# Patient Record
Sex: Female | Born: 1970 | Race: White | Hispanic: No | Marital: Married | Smoking: Never smoker
Health system: Southern US, Community
[De-identification: ages and names within clinical notes are randomized; demographics above are authoritative.]

## PROBLEM LIST (undated history)

## (undated) DIAGNOSIS — O24419 Gestational diabetes mellitus in pregnancy, unspecified control: Secondary | ICD-10-CM

## (undated) DIAGNOSIS — I509 Heart failure, unspecified: Secondary | ICD-10-CM

## (undated) DIAGNOSIS — K219 Gastro-esophageal reflux disease without esophagitis: Secondary | ICD-10-CM

## (undated) DIAGNOSIS — C801 Malignant (primary) neoplasm, unspecified: Secondary | ICD-10-CM

## (undated) DIAGNOSIS — J45909 Unspecified asthma, uncomplicated: Secondary | ICD-10-CM

## (undated) DIAGNOSIS — E7211 Homocystinuria: Secondary | ICD-10-CM

## (undated) DIAGNOSIS — E7212 Methylenetetrahydrofolate reductase deficiency: Secondary | ICD-10-CM

## (undated) DIAGNOSIS — D759 Disease of blood and blood-forming organs, unspecified: Secondary | ICD-10-CM

## (undated) DIAGNOSIS — Z87448 Personal history of other diseases of urinary system: Secondary | ICD-10-CM

## (undated) DIAGNOSIS — I10 Essential (primary) hypertension: Secondary | ICD-10-CM

## (undated) DIAGNOSIS — D473 Essential (hemorrhagic) thrombocythemia: Secondary | ICD-10-CM

## (undated) DIAGNOSIS — Z9221 Personal history of antineoplastic chemotherapy: Secondary | ICD-10-CM

## (undated) DIAGNOSIS — D45 Polycythemia vera: Secondary | ICD-10-CM

## (undated) DIAGNOSIS — O0990 Supervision of high risk pregnancy, unspecified, unspecified trimester: Secondary | ICD-10-CM

## (undated) HISTORY — PX: WISDOM TOOTH EXTRACTION: SHX21

## (undated) HISTORY — DX: Homocystinuria: E72.11

## (undated) HISTORY — DX: Personal history of other diseases of urinary system: Z87.448

## (undated) HISTORY — DX: Gestational diabetes mellitus in pregnancy, unspecified control: O24.419

## (undated) HISTORY — DX: Methylenetetrahydrofolate reductase deficiency: E72.12

## (undated) HISTORY — PX: TONSILLECTOMY: SUR1361

## (undated) HISTORY — PX: MYOMECTOMY: SHX85

## (undated) HISTORY — PX: COLONOSCOPY: SHX174

## (undated) HISTORY — DX: Essential (hemorrhagic) thrombocythemia: D47.3

## (undated) HISTORY — DX: Supervision of high risk pregnancy, unspecified, unspecified trimester: O09.90

---

## 1998-03-21 ENCOUNTER — Ambulatory Visit (HOSPITAL_COMMUNITY): Admission: RE | Admit: 1998-03-21 | Discharge: 1998-03-21 | Payer: Self-pay | Admitting: Internal Medicine

## 1999-04-29 ENCOUNTER — Ambulatory Visit (HOSPITAL_COMMUNITY): Admission: RE | Admit: 1999-04-29 | Discharge: 1999-04-29 | Payer: Self-pay | Admitting: Otolaryngology

## 1999-04-29 ENCOUNTER — Encounter: Payer: Self-pay | Admitting: Otolaryngology

## 2001-06-20 ENCOUNTER — Encounter: Payer: Self-pay | Admitting: Emergency Medicine

## 2001-06-20 ENCOUNTER — Emergency Department (HOSPITAL_COMMUNITY): Admission: EM | Admit: 2001-06-20 | Discharge: 2001-06-20 | Payer: Self-pay | Admitting: Emergency Medicine

## 2004-09-21 ENCOUNTER — Encounter: Admission: RE | Admit: 2004-09-21 | Discharge: 2004-09-21 | Payer: Self-pay | Admitting: Specialist

## 2004-10-30 ENCOUNTER — Encounter: Admission: RE | Admit: 2004-10-30 | Discharge: 2004-10-30 | Payer: Self-pay | Admitting: Specialist

## 2004-12-10 ENCOUNTER — Encounter: Admission: RE | Admit: 2004-12-10 | Discharge: 2004-12-10 | Payer: Self-pay | Admitting: Specialist

## 2006-12-30 ENCOUNTER — Encounter: Admission: RE | Admit: 2006-12-30 | Discharge: 2006-12-30 | Payer: Self-pay | Admitting: Specialist

## 2014-01-15 ENCOUNTER — Other Ambulatory Visit (HOSPITAL_COMMUNITY): Payer: Self-pay | Admitting: Obstetrics and Gynecology

## 2014-01-15 DIAGNOSIS — IMO0002 Reserved for concepts with insufficient information to code with codable children: Secondary | ICD-10-CM

## 2014-01-22 ENCOUNTER — Ambulatory Visit (HOSPITAL_COMMUNITY): Payer: Self-pay

## 2014-01-24 ENCOUNTER — Ambulatory Visit (HOSPITAL_COMMUNITY)
Admission: RE | Admit: 2014-01-24 | Discharge: 2014-01-24 | Disposition: A | Discharge status: Home / Self Care | Payer: BC Managed Care – PPO | Source: Ambulatory Visit | Attending: Obstetrics and Gynecology | Admitting: Obstetrics and Gynecology

## 2014-01-24 DIAGNOSIS — IMO0002 Reserved for concepts with insufficient information to code with codable children: Secondary | ICD-10-CM

## 2014-01-24 DIAGNOSIS — N979 Female infertility, unspecified: Secondary | ICD-10-CM | POA: Insufficient documentation

## 2014-01-24 MED ORDER — IOHEXOL 300 MG/ML  SOLN
20.0000 mL | Freq: Once | INTRAMUSCULAR | Status: AC | PRN
  Administered 2014-01-24: 20 mL

## 2014-06-28 ENCOUNTER — Other Ambulatory Visit (HOSPITAL_COMMUNITY): Payer: Self-pay | Admitting: Obstetrics and Gynecology

## 2014-06-28 DIAGNOSIS — N979 Female infertility, unspecified: Secondary | ICD-10-CM

## 2014-07-01 ENCOUNTER — Other Ambulatory Visit (HOSPITAL_COMMUNITY): Payer: Self-pay | Admitting: Obstetrics and Gynecology

## 2014-07-01 DIAGNOSIS — O039 Complete or unspecified spontaneous abortion without complication: Secondary | ICD-10-CM

## 2014-07-01 DIAGNOSIS — O2621 Pregnancy care for patient with recurrent pregnancy loss, first trimester: Secondary | ICD-10-CM

## 2014-07-08 ENCOUNTER — Ambulatory Visit (HOSPITAL_COMMUNITY)
Admission: RE | Admit: 2014-07-08 | Discharge: 2014-07-08 | Disposition: A | Discharge status: Home / Self Care | Payer: BC Managed Care – PPO | Source: Ambulatory Visit | Attending: Obstetrics and Gynecology | Admitting: Obstetrics and Gynecology

## 2014-07-08 DIAGNOSIS — N979 Female infertility, unspecified: Secondary | ICD-10-CM | POA: Diagnosis not present

## 2014-07-08 DIAGNOSIS — O2621 Pregnancy care for patient with recurrent pregnancy loss, first trimester: Secondary | ICD-10-CM

## 2014-07-08 DIAGNOSIS — O039 Complete or unspecified spontaneous abortion without complication: Secondary | ICD-10-CM

## 2014-07-08 MED ORDER — IOHEXOL 300 MG/ML  SOLN
20.0000 mL | Freq: Once | INTRAMUSCULAR | Status: AC | PRN
  Administered 2014-07-08: 20 mL

## 2015-01-02 ENCOUNTER — Other Ambulatory Visit: Payer: Self-pay | Admitting: Obstetrics and Gynecology

## 2015-01-03 ENCOUNTER — Encounter (HOSPITAL_COMMUNITY): Payer: Self-pay

## 2015-01-03 NOTE — Progress Notes (Signed)
Dr. Jillyn Hidden made aware of patients Polycythemia Vera Cancer no new orders received at this time.

## 2015-01-06 MED ORDER — CEFAZOLIN SODIUM-DEXTROSE 2-3 GM-% IV SOLR
2.0000 g | INTRAVENOUS | Status: AC
  Administered 2015-01-07: 2 g via INTRAVENOUS

## 2015-01-06 NOTE — Anesthesia Preprocedure Evaluation (Addendum)
Anesthesia Evaluation  Patient identified by MRN, date of birth, ID band Patient awake    Reviewed: Allergy & Precautions, NPO status , Patient's Chart, lab work & pertinent test results  History of Anesthesia Complications Negative for: history of anesthetic complications  Airway Mallampati: I  TM Distance: >3 FB Neck ROM: Full    Dental no notable dental hx. (+) Dental Advisory Given   Pulmonary asthma ,    Pulmonary exam normal       Cardiovascular negative cardio ROS      Neuro/Psych negative neurological ROS  negative psych ROS   GI/Hepatic Neg liver ROS, GERD-  Medicated and Controlled,  Endo/Other  obesity  Renal/GU negative Renal ROS  negative genitourinary   Musculoskeletal negative musculoskeletal ROS (+)   Abdominal Normal abdominal exam  (+)   Peds negative pediatric ROS (+)  Hematology  (+) Blood dyscrasia (polycythemia vera), ,   Anesthesia Other Findings   Reproductive/Obstetrics (+) Pregnancy                           Anesthesia Physical Anesthesia Plan  ASA: II  Anesthesia Plan: MAC and General   Post-op Pain Management:    Induction: Intravenous  Airway Management Planned: LMA  Additional Equipment:   Intra-op Plan:   Post-operative Plan:   Informed Consent: I have reviewed the patients History and Physical, chart, labs and discussed the procedure including the risks, benefits and alternatives for the proposed anesthesia with the patient or authorized representative who has indicated his/her understanding and acceptance.   Dental advisory given  Plan Discussed with: CRNA and Surgeon  Anesthesia Plan Comments:        Anesthesia Quick Evaluation

## 2015-01-06 NOTE — H&P (Signed)
[name], Leslie Velasquez NO.:  [id number]  MEDICAL RECORD NO.:  [id number]  LOCATION:  [facility]                         FACILITY:  [facility]  PHYSICIAN:  Lovenia Kim, M.D.DATE OF BIRTH:  [birth year]  DATE OF ADMISSION:  01/02/2015 DATE OF DISCHARGE:                             HISTORY & PHYSICAL   CHIEF COMPLAINT:  Missed abortion.  HISTORY OF PRESENT ILLNESS:  A 44 year old white female, G3, P0 with a history of recurrent pregnancy loss for D and E for missed AB and tissue for chromosomes.  ALLERGIES:  She has no known drug allergies.  MEDICATIONS:  Folic acid, interferon, reflux medication, baby aspirin, multivitamins.  PAST MEDICAL HISTORY:  She has a history of miscarriage x2.  SURGICAL HISTORY:  For submucous fibroid removal, tonsillectomy, wisdom tooth extraction, breast augmentation.  FAMILY HISTORY:  Diabetes, breast cancer, hypertension, esophageal cancer.  Personal history of essential thrombocythemia.  PHYSICAL EXAMINATION:  GENERAL:  Well-developed well-nourished white female, in no acute distress. HEENT:  Normal. NECK:  Supple.  Full range of motion. LUNGS:  Clear. HEART:  Regular rate and rhythm. ABDOMEN:  Soft, nontender.  Pelvic exam reveals a big-sized uterus and no adnexal masses. EXTREMITIES:  There are no cords. NEUROLOGIC:  Nonfocal. SKIN:  Intact.  IMPRESSION:  Missed abortion at [redacted] weeks gestation with 8-week fetal demise noted.  PLAN:  Suction D and E with tissue for chromosomes.  Risks of anesthesia, infection, bleeding, injury to surrounding organs, possible need for repair was discussed.  Delayed versus immediate complications to include bowel and bladder injury noted.  The patient acknowledges and wishes to proceed.     Lovenia Kim, M.D.     RJT/MEDQ  D:  01/06/2015  T:  01/06/2015  Job:  [id number]  cc:   Lovenia Kim, M.D. Fax: [phone]

## 2015-01-07 ENCOUNTER — Ambulatory Visit (HOSPITAL_COMMUNITY): Payer: BLUE CROSS/BLUE SHIELD | Admitting: Anesthesiology

## 2015-01-07 ENCOUNTER — Encounter (HOSPITAL_COMMUNITY): Payer: Self-pay | Admitting: Anesthesiology

## 2015-01-07 ENCOUNTER — Ambulatory Visit (HOSPITAL_COMMUNITY)
Admission: RE | Admit: 2015-01-07 | Discharge: 2015-01-07 | Disposition: A | Discharge status: Home / Self Care | Payer: BLUE CROSS/BLUE SHIELD | Source: Ambulatory Visit | Attending: Obstetrics and Gynecology | Admitting: Obstetrics and Gynecology

## 2015-01-07 ENCOUNTER — Encounter (HOSPITAL_COMMUNITY)
Admission: RE | Disposition: A | Discharge status: Home / Self Care | Payer: Self-pay | Source: Ambulatory Visit | Attending: Obstetrics and Gynecology

## 2015-01-07 DIAGNOSIS — O021 Missed abortion: Secondary | ICD-10-CM | POA: Diagnosis present

## 2015-01-07 DIAGNOSIS — N96 Recurrent pregnancy loss: Secondary | ICD-10-CM | POA: Insufficient documentation

## 2015-01-07 HISTORY — PX: DILATION AND EVACUATION: SHX1459

## 2015-01-07 HISTORY — DX: Unspecified asthma, uncomplicated: J45.909

## 2015-01-07 HISTORY — DX: Personal history of antineoplastic chemotherapy: Z92.21

## 2015-01-07 HISTORY — DX: Malignant (primary) neoplasm, unspecified: C80.1

## 2015-01-07 HISTORY — DX: Disease of blood and blood-forming organs, unspecified: D75.9

## 2015-01-07 HISTORY — DX: Gastro-esophageal reflux disease without esophagitis: K21.9

## 2015-01-07 HISTORY — DX: Polycythemia vera: D45

## 2015-01-07 LAB — CBC
HCT: 40.5 % (ref 36.0–46.0)
HEMOGLOBIN: 13.4 g/dL (ref 12.0–15.0)
MCH: 27.6 pg (ref 26.0–34.0)
MCHC: 33.1 g/dL (ref 30.0–36.0)
MCV: 83.3 fL (ref 78.0–100.0)
Platelets: 802 10*3/uL — ABNORMAL HIGH (ref 150–400)
RBC: 4.86 MIL/uL (ref 3.87–5.11)
RDW: 21.4 % — ABNORMAL HIGH (ref 11.5–15.5)
WBC: 9 10*3/uL (ref 4.0–10.5)

## 2015-01-07 SURGERY — DILATION AND EVACUATION, UTERUS
Anesthesia: Monitor Anesthesia Care

## 2015-01-07 MED ORDER — FENTANYL CITRATE 0.05 MG/ML IJ SOLN
25.0000 ug | INTRAMUSCULAR | Status: DC | PRN
  Administered 2015-01-07 (×3): 50 ug via INTRAVENOUS

## 2015-01-07 MED ORDER — SCOPOLAMINE 1 MG/3DAYS TD PT72: 1.0000 | MEDICATED_PATCH | Freq: Once | TRANSDERMAL | Status: DC

## 2015-01-07 MED ORDER — LIDOCAINE HCL (CARDIAC) 20 MG/ML IV SOLN
INTRAVENOUS | Status: DC | PRN
  Administered 2015-01-07: 30 mg via INTRAVENOUS

## 2015-01-07 MED ORDER — ONDANSETRON HCL 4 MG/2ML IJ SOLN: 4.0000 mg | Freq: Once | INTRAMUSCULAR | Status: DC | PRN

## 2015-01-07 MED ORDER — FENTANYL CITRATE 0.05 MG/ML IJ SOLN
INTRAMUSCULAR | Status: AC
  Administered 2015-01-07: 50 ug via INTRAVENOUS
  Filled 2015-01-07: qty 2

## 2015-01-07 MED ORDER — FENTANYL CITRATE 0.05 MG/ML IJ SOLN
INTRAMUSCULAR | Status: AC
  Filled 2015-01-07: qty 2

## 2015-01-07 MED ORDER — BUPIVACAINE HCL (PF) 0.25 % IJ SOLN
INTRAMUSCULAR | Status: DC | PRN
  Administered 2015-01-07: 20 mL

## 2015-01-07 MED ORDER — FENTANYL CITRATE 0.05 MG/ML IJ SOLN
INTRAMUSCULAR | Status: DC | PRN
  Administered 2015-01-07: 100 ug via INTRAVENOUS

## 2015-01-07 MED ORDER — SCOPOLAMINE 1 MG/3DAYS TD PT72
MEDICATED_PATCH | TRANSDERMAL | Status: AC
  Filled 2015-01-07: qty 1

## 2015-01-07 MED ORDER — CEFAZOLIN SODIUM-DEXTROSE 2-3 GM-% IV SOLR
INTRAVENOUS | Status: AC
  Filled 2015-01-07: qty 50

## 2015-01-07 MED ORDER — LACTATED RINGERS IV SOLN
INTRAVENOUS | Status: DC
  Administered 2015-01-07: 12:00:00 via INTRAVENOUS

## 2015-01-07 MED ORDER — 0.9 % SODIUM CHLORIDE (POUR BTL) OPTIME
TOPICAL | Status: DC | PRN
  Administered 2015-01-07: 1000 mL

## 2015-01-07 MED ORDER — ONDANSETRON HCL 4 MG/2ML IJ SOLN
INTRAMUSCULAR | Status: DC | PRN
  Administered 2015-01-07: 4 mg via INTRAVENOUS

## 2015-01-07 MED ORDER — HYDROCODONE-ACETAMINOPHEN 5-325 MG PO TABS
1.0000 | ORAL_TABLET | Freq: Once | ORAL | Status: AC
  Administered 2015-01-07: 1 via ORAL

## 2015-01-07 MED ORDER — MIDAZOLAM HCL 2 MG/2ML IJ SOLN
INTRAMUSCULAR | Status: DC | PRN
  Administered 2015-01-07: 2 mg via INTRAVENOUS

## 2015-01-07 MED ORDER — DEXAMETHASONE SODIUM PHOSPHATE 10 MG/ML IJ SOLN
INTRAMUSCULAR | Status: DC | PRN
  Administered 2015-01-07: 4 mg via INTRAVENOUS

## 2015-01-07 MED ORDER — HYDROCODONE-ACETAMINOPHEN 5-325 MG PO TABS
ORAL_TABLET | ORAL | Status: AC
  Filled 2015-01-07: qty 1

## 2015-01-07 MED ORDER — HYDROCODONE-IBUPROFEN 7.5-200 MG PO TABS: 1.0000 | ORAL_TABLET | Freq: Three times a day (TID) | ORAL | Status: DC | PRN

## 2015-01-07 MED ORDER — BUPIVACAINE HCL (PF) 0.25 % IJ SOLN
INTRAMUSCULAR | Status: AC
  Filled 2015-01-07: qty 30

## 2015-01-07 MED ORDER — PROPOFOL 10 MG/ML IV BOLUS
INTRAVENOUS | Status: DC | PRN
  Administered 2015-01-07 (×3): 20 mg via INTRAVENOUS

## 2015-01-07 MED ORDER — KETOROLAC TROMETHAMINE 30 MG/ML IJ SOLN
INTRAMUSCULAR | Status: DC | PRN
  Administered 2015-01-07: 30 mg via INTRAVENOUS

## 2015-01-07 SURGICAL SUPPLY — 16 items
CATH ROBINSON RED A/P 16FR (CATHETERS) ×2 IMPLANT
CLOTH BEACON ORANGE TIMEOUT ST (SAFETY) ×2 IMPLANT
DECANTER SPIKE VIAL GLASS SM (MISCELLANEOUS) ×2 IMPLANT
GLOVE BIO SURGEON STRL SZ7.5 (GLOVE) ×2 IMPLANT
GOWN STRL REUS W/TWL LRG LVL3 (GOWN DISPOSABLE) ×4 IMPLANT
KIT BERKELEY 1ST TRIMESTER 3/8 (MISCELLANEOUS) ×2 IMPLANT
NS IRRIG 1000ML POUR BTL (IV SOLUTION) ×2 IMPLANT
PACK VAGINAL MINOR WOMEN LF (CUSTOM PROCEDURE TRAY) ×2 IMPLANT
PAD OB MATERNITY 4.3X12.25 (PERSONAL CARE ITEMS) ×2 IMPLANT
PAD PREP 24X48 CUFFED NSTRL (MISCELLANEOUS) ×2 IMPLANT
SET BERKELEY SUCTION TUBING (SUCTIONS) ×2 IMPLANT
TOWEL OR 17X24 6PK STRL BLUE (TOWEL DISPOSABLE) ×4 IMPLANT
VACURETTE 10 RIGID CVD (CANNULA) IMPLANT
VACURETTE 7MM CVD STRL WRAP (CANNULA) IMPLANT
VACURETTE 8 RIGID CVD (CANNULA) IMPLANT
VACURETTE 9 RIGID CVD (CANNULA) ×1 IMPLANT

## 2015-01-07 NOTE — Anesthesia Postprocedure Evaluation (Signed)
Anesthesia Post Note  Patient: Leslie Velasquez  Procedure(s) Performed: Procedure(s) (LRB): DILATATION AND EVACUATION with Tissue Sent For Chromosome Analysis (N/A)  Anesthesia type: MAC  Patient location: PACU  Post pain: Pain level controlled  Post assessment: Post-op Vital signs reviewed  Last Vitals:  Filed Vitals:   01/07/15 1400  BP: 120/52  Pulse: 83  Temp:   Resp: 21    Post vital signs: Reviewed  Level of consciousness: sedated  Complications: No apparent anesthesia complications

## 2015-01-07 NOTE — Transfer of Care (Signed)
Immediate Anesthesia Transfer of Care Note  Patient: Leslie Velasquez  Procedure(s) Performed: Procedure(s): DILATATION AND EVACUATION with Tissue Sent For Chromosome Analysis (N/A)  Patient Location: PACU  Anesthesia Type:MAC  Level of Consciousness: sedated  Airway & Oxygen Therapy: Patient Spontanous Breathing  Post-op Assessment: Report given to RN  Post vital signs: Reviewed and stable  Last Vitals:  Filed Vitals:   01/07/15 1154  BP: 126/75  Pulse:   Temp:   Resp:     Complications: No apparent anesthesia complications

## 2015-01-07 NOTE — Discharge Instructions (Signed)
DISCHARGE INSTRUCTIONS: D&E The following instructions have been prepared to help you care for yourself upon your return home.  MAY TAKE IBUPROFEN (MOTRIN, ADVIL) OR ALEVE AFTER 7:25 PM FOR PAIN!!    Personal hygiene:  Use sanitary pads for vaginal drainage, not tampons.  Shower the day after your procedure.  NO tub baths, pools or Jacuzzis for 2-3 weeks.  Wipe front to back after using the bathroom.  Activity and limitations:  Do NOT drive or operate any equipment for 24 hours. The effects of anesthesia are still present and drowsiness may result.  Do NOT rest in bed all day.  Walking is encouraged.  Walk up and down stairs slowly.  You may resume your normal activity in one to two days or as indicated by your physician.  Sexual activity: NO intercourse for at least 2 weeks after the procedure, or as indicated by your physician.  Diet: Eat a light meal as desired this evening. You may resume your usual diet tomorrow.  Return to work: You may resume your work activities in one to two days or as indicated by your doctor.  What to expect after your surgery: Expect to have vaginal bleeding/discharge for 2-3 days and spotting for up to 10 days. It is not unusual to have soreness for up to 1-2 weeks. You may have a slight burning sensation when you urinate for the first day. Mild cramps may continue for a couple of days. You may have a regular period in 2-6 weeks.  Call your doctor for any of the following:  Excessive vaginal bleeding, saturating and changing one pad every hour.  Inability to urinate 6 hours after discharge from hospital.  Pain not relieved by pain medication.  Fever of 100.4 F or greater.  Unusual vaginal discharge or odor.   Call for an appointment:    Patients signature: ______________________  Nurses signature ________________________  Support person's signature_______________________

## 2015-01-07 NOTE — Op Note (Signed)
01/07/2015  1:27 PM  PATIENT:  Leslie Velasquez  44 y.o. female  PRE-OPERATIVE DIAGNOSIS:  Missed Abortion, Recurrent Pregnancy Loss  POST-OPERATIVE DIAGNOSIS:  Missed Abortion, Recurrent Pregnancy Loss  PROCEDURE:  Procedure(s): DILATATION AND EVACUATION with Tissue Sent For Chromosome Analysis  SURGEON:  Surgeon(s): Brien Few, MD  ASSISTANTS: none   ANESTHESIA:   local and IV sedation  ESTIMATED BLOOD LOSS: * No blood loss amount entered *   DRAINS: none   LOCAL MEDICATIONS USED:  MARCAINE    and Amount: 20 ml  SPECIMEN:  Source of Specimen:  poc  DISPOSITION OF SPECIMEN:  PATHOLOGY  COUNTS:  YES  DICTATION #: [id number]  PLAN OF CARE: dc home  PATIENT DISPOSITION:  PACU - hemodynamically stable.

## 2015-01-07 NOTE — Progress Notes (Signed)
Patient ID: Leslie Velasquez, female   DOB: 09-30-1971, 45 y.o.   MRN: [id number] Patient seen and examined. Consent witnessed and signed. No changes noted. Update completed.

## 2015-01-08 ENCOUNTER — Encounter (HOSPITAL_COMMUNITY): Payer: Self-pay | Admitting: Obstetrics and Gynecology

## 2015-01-08 NOTE — Op Note (Signed)
Leslie Velasquez, Leslie Velasquez NO.:  [id number]  MEDICAL RECORD NO.:  [id number]  LOCATION:  [facility]                          FACILITY:  [facility]  PHYSICIAN:  Lovenia Kim, M.D.DATE OF BIRTH:  June 02, 1971  DATE OF PROCEDURE: DATE OF DISCHARGE:  01/07/2015                              OPERATIVE REPORT   PREOPERATIVE DIAGNOSES:  Missed abortion at 11 weeks, recurrent pregnancy loss x3.  POSTOPERATIVE DIAGNOSES:  Missed abortion at 11 weeks, recurrent pregnancy loss x3.  PROCEDURE:  Suction, dilatation and evacuation.  SURGEON:  Lovenia Kim, M.D.  ASSISTANT:  None.  ANESTHESIA:  IV sedation and local.  ESTIMATED BLOOD LOSS:  Less than 50 mL.  COMPLICATIONS:  None.  DRAINS:  None.  COUNTS:  Correct.  TISSUE:  Products of conception to Pathology for permanent confirmation and tissue sent for chromosomal analysis and aneuploidy FISH screen.  DESCRIPTION OF PROCEDURE:  After being apprised of the risks of anesthesia, infection, bleeding, injury to surrounding organs with possible need for repair, the patient was brought to the operating room. Time-out was performed.  The patient was then placed in dorsal lithotomy position, prepped and draped in usual sterile fashion.  Catheterized until the bladder was empty.  Feet were placed in the Yellofin stirrups. Exam under anesthesia revealed 8-10-week size anteflexed uterus and no adnexal masses.  Dilute Marcaine solution was placed using 20 mL total of a dilute 0.25% Marcaine solution.  Cervix was easily dilated up to a #27 Pratt dilator.  A 9-mm suction curette placed.  Aspiration of products of conception was done without difficulty.  Repeat suction and curettage bluntly in a 4-quadrant method revealed the cavity to be empty.  Tissue was collected and inspected, sent for analysis as noted.  The patient tolerated the procedure well.  All instruments were removed.  She was awakened and transferred to recovery in  good condition.     Lovenia Kim, M.D.     RJT/MEDQ  D:  01/07/2015  T:  01/08/2015  Job:  [id number]

## 2015-01-28 NOTE — Addendum Note (Signed)
Addendum  created 01/28/15 0710 by Lyn Hollingshead, MD   Modules edited: Clinical Notes   Clinical Notes:  File: [id number]

## 2015-01-28 NOTE — Addendum Note (Signed)
Addendum  created 01/28/15 0211 by Lyn Hollingshead, MD   Modules edited: Anesthesia Attestations

## 2015-01-31 LAB — CHROMOSOME STD, POC(TISSUE)-NCBH

## 2015-02-10 LAB — TISSUE HYBRIDIZATION TO NCBH

## 2015-08-07 LAB — OB RESULTS CONSOLE GC/CHLAMYDIA
Chlamydia: NEGATIVE
Gonorrhea: NEGATIVE

## 2015-08-07 LAB — OB RESULTS CONSOLE RPR: RPR: NONREACTIVE

## 2015-08-07 LAB — OB RESULTS CONSOLE HEPATITIS B SURFACE ANTIGEN: Hepatitis B Surface Ag: NEGATIVE

## 2015-08-07 LAB — OB RESULTS CONSOLE HIV ANTIBODY (ROUTINE TESTING): HIV: NONREACTIVE

## 2015-08-07 LAB — OB RESULTS CONSOLE RUBELLA ANTIBODY, IGM: Rubella: IMMUNE

## 2015-09-17 LAB — OB RESULTS CONSOLE ANTIBODY SCREEN: Antibody Screen: NEGATIVE

## 2015-09-17 LAB — OB RESULTS CONSOLE ABO/RH: RH Type: POSITIVE

## 2015-12-03 ENCOUNTER — Encounter: Payer: BLUE CROSS/BLUE SHIELD | Attending: Obstetrics and Gynecology

## 2015-12-03 VITALS — Ht 64.0 in | Wt 224.0 lb

## 2015-12-03 DIAGNOSIS — O99810 Abnormal glucose complicating pregnancy: Secondary | ICD-10-CM | POA: Insufficient documentation

## 2015-12-03 DIAGNOSIS — O24419 Gestational diabetes mellitus in pregnancy, unspecified control: Secondary | ICD-10-CM

## 2015-12-06 NOTE — Progress Notes (Signed)
  Patient was seen on 12/03/15 for Gestational Diabetes self-management . The following learning objectives were met by the patient :   States the definition of Gestational Diabetes  States why dietary management is important in controlling blood glucose  Describes the effects of carbohydrates on blood glucose levels  Demonstrates ability to create a balanced meal plan  Demonstrates carbohydrate counting   States when to check blood glucose levels  Demonstrates proper blood glucose monitoring techniques  States the effect of stress and exercise on blood glucose levels  States the importance of limiting caffeine and abstaining from alcohol and smoking  Plan:  Aim for 2 Carb Choices per meal (30 grams) +/- 1 either way for breakfast Aim for 3 Carb Choices per meal (45 grams) +/- 1 either way from lunch and dinner Aim for 1-2 Carbs per snack Begin reading food labels for Total Carbohydrate and sugar grams of foods Consider  increasing your activity level by walking daily as tolerated Begin checking BG before breakfast and 1-2 hours after first bit of breakfast, lunch and dinner after  as directed by MD  Take medication  as directed by MD  Blood glucose monitor given: One Touch Verio Flex Lot # [id number] X Exp: 12/2016 Blood glucose reading: '81mg'$ /dl  Patient instructed to monitor glucose levels: FBS: 60 - <90 1 hour: <140 2 hour: <120  Patient received the following handouts:  Nutrition Diabetes and Pregnancy  Carbohydrate Counting List  Meal Planning worksheet  Patient will be seen for follow-up as needed.

## 2016-01-29 ENCOUNTER — Encounter (HOSPITAL_COMMUNITY): Payer: Self-pay | Admitting: *Deleted

## 2016-01-29 ENCOUNTER — Telehealth (HOSPITAL_COMMUNITY): Payer: Self-pay | Admitting: *Deleted

## 2016-01-29 NOTE — Telephone Encounter (Signed)
Preadmission screen  

## 2016-01-30 ENCOUNTER — Other Ambulatory Visit: Payer: Self-pay | Admitting: Obstetrics and Gynecology

## 2016-02-02 ENCOUNTER — Encounter: Payer: Self-pay | Admitting: Oncology

## 2016-02-02 ENCOUNTER — Ambulatory Visit (INDEPENDENT_AMBULATORY_CARE_PROVIDER_SITE_OTHER): Payer: BLUE CROSS/BLUE SHIELD | Admitting: Oncology

## 2016-02-02 ENCOUNTER — Telehealth: Payer: Self-pay | Admitting: *Deleted

## 2016-02-02 VITALS — BP 144/77 | HR 84 | Temp 98.4°F | Ht 66.0 in | Wt 242.7 lb

## 2016-02-02 DIAGNOSIS — D45 Polycythemia vera: Secondary | ICD-10-CM

## 2016-02-02 DIAGNOSIS — O24419 Gestational diabetes mellitus in pregnancy, unspecified control: Secondary | ICD-10-CM | POA: Diagnosis not present

## 2016-02-02 DIAGNOSIS — O26893 Other specified pregnancy related conditions, third trimester: Secondary | ICD-10-CM | POA: Diagnosis not present

## 2016-02-02 DIAGNOSIS — O0990 Supervision of high risk pregnancy, unspecified, unspecified trimester: Secondary | ICD-10-CM | POA: Insufficient documentation

## 2016-02-02 DIAGNOSIS — D473 Essential (hemorrhagic) thrombocythemia: Secondary | ICD-10-CM | POA: Insufficient documentation

## 2016-02-02 DIAGNOSIS — O0993 Supervision of high risk pregnancy, unspecified, third trimester: Secondary | ICD-10-CM

## 2016-02-02 DIAGNOSIS — Z3A36 36 weeks gestation of pregnancy: Secondary | ICD-10-CM

## 2016-02-02 HISTORY — DX: Gestational diabetes mellitus in pregnancy, unspecified control: O24.419

## 2016-02-02 HISTORY — DX: Supervision of high risk pregnancy, unspecified, unspecified trimester: O09.90

## 2016-02-02 HISTORY — DX: Essential (hemorrhagic) thrombocythemia: D47.3

## 2016-02-02 HISTORY — DX: Polycythemia vera: D45

## 2016-02-02 MED ORDER — ENOXAPARIN SODIUM 40 MG/0.4ML ~~LOC~~ SOLN: 60.0000 mg | SUBCUTANEOUS | Status: DC

## 2016-02-02 NOTE — Telephone Encounter (Signed)
Pharm calls and ask for clarification of new lovenox script, read your Pt instructions and told them that it is to be lovenox 60mg ,  inject 0.64mls/  60 mg into the skin daily. Do you agree? Please change med list to reflect, thanks

## 2016-02-02 NOTE — Telephone Encounter (Signed)
Yes! I already changed med list when I updated the order. What I didn't know is whether they had 60 mg in a pre-filled syringe

## 2016-02-02 NOTE — Progress Notes (Signed)
Patient ID: Leslie Velasquez, female   DOB: [birth year], 45 y.o.   MRN: [id number] New Patient Hematology   Leslie Velasquez [id number] Leslie Velasquez 45 y.o. 02/02/2016  CC: Dr. Brien Few;    Reason for referral:  Assist in management of high risk pregnancy in a young woman with an underlying myeloproliferative disorder   HPI:  45 year old woman who was in overall good health until age 77. She was found have a persistently elevated lately count of over 800,000. She had excessive fatigue, a sense of increased pressure in her head, night sweats, and almost daily headaches. She was referred to Dr. 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 This was initially just limited to an elevated platelet count but over time, she had progressive erythrocytosis with hematocrit up to 54% and a low erythropoietin level consistent with polycythemia vera. She was started on low-dose aspirin and a phlebotomy program. The majority of her symptoms resolved. She became pregnant about 2 years ago at age 77. She had a spontaneous miscarriage that was associated with significant bleeding. She became pregnant again about 8 months later and had another miscarriage. She underwent a D&C procedure. She does not recall having excessive bleeding following that. She had surgery to control menorrhagia related to a fibroid uterus approximately 3 years ago. Her platelet count became difficult to control. About one year ago she was started on pegylated interferon. The dose has been titrated to current dose of 180 mg subcutaneous weekly. She is now pregnant for the third time. The pregnancy is progressing well except for borderline elevation of blood pressure and gestational diabetes. She was started on glyburide. An elective cesarean section is planned for next week on May 2. Her hemoglobin has drifted down during the pregnancy and  she has not required phlebotomy. Last phlebotomy done approximately August 2016. Her platelet count has also come under better control with most recent value of 249,000 on April 21. Hemoglobin 10.6. MCV 92. White count 6500. She recently had a second opinion consultation at the [facility] in [place] where she saw a doctor [place]. I have some of the records from that visit. In view of her increased thrombotic risk,  prophylactic dose low molecular weight heparin was recommended for the 2 weeks antepartum and 4-6 weeks postpartum. This was just started last week at initial dose of 40 mg. Her prepregnant weight was 207 pounds. Current weight 243 pounds. BMI 39.  In view of the prior history of excessive bleeding following spontaneous miscarriage, and the fact that a small number of patients with essential thrombocythemia can develop an acquired von Willebrand's disorder, I had her OB/GYN check a von Willebrand panel on April 13. Results show what is expected for a normal pregnant woman with elevated levels of von Willebrand factor antigen (198% of control), and ristocetin cofactor activity, 135% of control, and factor VIII activity, 236% of control.  Von Willebrand multimers are normal.  BUN 13, creatinine 0.5, liver chemistries normal except decreased albumin 3.3 g percent and elevated alkaline phosphatase 138 normal for pregnancy. TSH 2.35. Uric acid 5.7.    PMH: Past Medical History  Diagnosis Date  . Asthma   . GERD (gastroesophageal reflux disease)   . 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 Cancer ([place])     polycythemia vera cancer  . Polycythemia vera ([place])   . Personal history of chemotherapy  currently every Monday  . Gestational diabetes mellitus (GDM), antepartum   . 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 She has asthma and seasonal allergies and uses when necessary  antihistamines. She had recurrent kidney infections as a child but not as an adult.  Past Surgical History  Procedure Laterality Date  . Myomectomy: Done for menorrhagia/fibroid uterus     . Tonsillectomy: With no excessive bleeding     . Wisdom tooth extraction: With no excessive bleeding     . Colonoscopy    . Dilation and evacuation: No excessive bleeding  N/A 01/07/2015    Procedure: DILATATION AND EVACUATION with Tissue Sent For Chromosome Analysis;  Surgeon: Brien Few, MD;  Location: [facility];  Service: Gynecology;  Laterality: N/A;    Allergies: No Known Allergies  Medications:  Current outpatient prescriptions:  .  enoxaparin (LOVENOX) 40 MG/0.4ML injection, Inject 0.6 mLs (60 mg total) into the skin daily., Disp: 30 Syringe, Rfl: 2 .  folic acid (FOLVITE) 1 MG tablet, Take 4 mg by mouth daily., Disp: , Rfl:  .  pantoprazole (PROTONIX) 40 MG tablet, Take 40 mg by mouth daily., Disp: , Rfl:  .  Peginterferon alfa-2a (PEGASYS [place]), Inject 1 each into the skin once a week. Patient receives injection every Monday at [facility] Disp: , Rfl:  .  Prenatal Vit-Fe Fumarate-FA (PRENATAL MULTIVITAMIN) TABS tablet, Take 1 tablet by mouth daily at 12 noon. , Disp: , Rfl:   Social History: She is married and her husband accompanies her today. She does not work outside the home.   she has never smoked. She has never used smokeless tobacco. she does not drink alcohol or use illicit drugs.  Family History: Her father is alive at age 49 and is being treated for advanced pancreatic cancer diagnosed in July 2016. Mother is alive with hypertension and diabetes. She has 2 brothers one younger one older both alive and healthy. No one else in the family has a primary blood disorder.  Review of Systems: See history of present illness Remaining ROS negative.  Physical Exam: Blood pressure 144/77, pulse 84, temperature 98.4 F (36.9 C), temperature source Oral, height 5\' 6"  (1.676 m), weight 242 lb  11.2 oz (110.088 kg), last menstrual period 05/26/2015, SpO2 98 %. Wt Readings from Last 3 Encounters:  02/02/16 242 lb 11.2 oz (110.088 kg)  12/06/15 224 lb (101.606 kg)  01/02/15 215 lb (97.523 kg)     General appearance: Well nourished Caucasian woman HENNT: Pharynx no erythema, exudate, mass, or ulcer. No thyromegaly or thyroid nodules Lymph nodes: No cervical, supraclavicular, or axillary lymphadenopathy Breasts:  Lungs: Clear to auscultation, resonant to percussion throughout Heart: Regular rhythm, no murmur, no gallop, no rub, no click,  1+ ankleedema Abdomen: term pregnancy Extremities: 1+ edema, no calf tenderness Musculoskeletal: no joint deformities Leg measurements: Right calf 49 cm, left 50 cm Right ankle 29 cm, left 28 cm GU:  Vascular: Carotid pulses 2+, no bruits,  Neurologic: Alert, oriented, PERRLA, optic discs sharp and vessels normal, no hemorrhage or exudate, cranial nerves grossly normal, motor strength 5 over 5, reflexes 1+ symmetric, upper body coordination normal, gait normal, Skin: No rash or ecchymosis    Lab Results: Lab Results  Component Value Date   WBC 9.0 01/07/2015   HGB 13.4 01/07/2015   HCT 40.5 01/07/2015   MCV 83.3 01/07/2015   PLT 802* 01/07/2015     Chemistry   No results  found for: NA, K, CL, CO2, BUN, CREATININE, GLU No results found for: CALCIUM, ALKPHOS, AST, ALT, BILITOT  Recent CBC 01/30/2016 with hemoglobin 10.6, hematocrit 32.7, MCV 92, white count 6500, platelets 249,000. , BUN 11, creatinine 0.5, albumin 3.0   Radiological Studies: No results found.  Impression: #1. 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 Despite history of excessive bleeding following a spontaneous miscarriage which I believe occurred prior to her diagnosis of MPD when platelet count was uncontrolled, I believe her risk for  thrombosis is higher than her risk for bleeding at present. No evidence for acquired von Willebrand's disorder as outlined above. Recommendation: Continue the interferon. Continue prophylactic low molecular weight heparin. I'm going to adjust the dose to her weight and increase to 60 mg of Lovenox daily. Continue for 6 weeks postpartum. Aspirin is on hold for the 2 weeks prior to planned C-section next week. It can be resumed postpartum if otherwise stable. She may be early preeclamptic and may require delivery prior to the planned date week.  We had a extended discussion along with her husband about the diagnosis prognosis and treatment options for polycythemia vera/essential thrombocythemia. If everything goes well with this pregnancy, she wants to get pregnant again right away. Therefore, we will not be using a drug like Hydrea anytime soon. Other medications were mentioned to her at the [facility] including Ruxolitinib (JAKAFI) but this drug is not approved for use in pregnant women. It may be useful for long-term management in patients who are resistant to or do not tolerate hydroxyurea which is the current drug of choice. She will continue the interferon for now. Another drug which I have had a positive  experience with is anagrelide which is particularly suited to young people. For some reason, the [facility] does not favor the use of this drug. It inhibits platelet production at the megakaryocyte level but is not myelosuppressive. We discussed some other advances in the field. One thing I find particularly attractive is a class of drugs called telomerase inhibitors. A small series of patients published in the [place] of Medicine last year were able to achieve molecular remissions with this class of drugs.      Murriel Hopper, MD, [place]  Hematology-Oncology/Internal Medicine [phone] 02/02/2016, 7:34 PM

## 2016-02-02 NOTE — Patient Instructions (Signed)
Increase lovenox to 60 mg daily Stop lovenox day before planned induction of labor; resume 12-24 hours after delivery and continue for 6 weeks; resume low dose aspirin after delivery Return visit for lab June 12 MD visit 1 week after lab

## 2016-02-03 ENCOUNTER — Encounter (HOSPITAL_COMMUNITY): Payer: Self-pay | Admitting: *Deleted

## 2016-02-03 ENCOUNTER — Telehealth (HOSPITAL_COMMUNITY): Payer: Self-pay | Admitting: *Deleted

## 2016-02-03 NOTE — Telephone Encounter (Signed)
Preadmission screen  

## 2016-02-06 ENCOUNTER — Telehealth (HOSPITAL_COMMUNITY): Payer: Self-pay | Admitting: *Deleted

## 2016-02-09 ENCOUNTER — Encounter (HOSPITAL_COMMUNITY)
Admission: RE | Admit: 2016-02-09 | Discharge: 2016-02-09 | Disposition: A | Discharge status: Home / Self Care | Payer: BLUE CROSS/BLUE SHIELD | Source: Ambulatory Visit | Attending: Obstetrics and Gynecology | Admitting: Obstetrics and Gynecology

## 2016-02-09 LAB — CBC
HCT: 29.6 % — ABNORMAL LOW (ref 36.0–46.0)
Hemoglobin: 9.8 g/dL — ABNORMAL LOW (ref 12.0–15.0)
MCH: 29.9 pg (ref 26.0–34.0)
MCHC: 33.1 g/dL (ref 30.0–36.0)
MCV: 90.2 fL (ref 78.0–100.0)
Platelets: 249 10*3/uL (ref 150–400)
RBC: 3.28 MIL/uL — ABNORMAL LOW (ref 3.87–5.11)
RDW: 16.2 % — AB (ref 11.5–15.5)
WBC: 6.6 10*3/uL (ref 4.0–10.5)

## 2016-02-09 LAB — BASIC METABOLIC PANEL
Anion gap: 8 (ref 5–15)
BUN: 10 mg/dL (ref 6–20)
CALCIUM: 8.3 mg/dL — AB (ref 8.9–10.3)
CHLORIDE: 107 mmol/L (ref 101–111)
CO2: 19 mmol/L — AB (ref 22–32)
CREATININE: 0.56 mg/dL (ref 0.44–1.00)
GFR calc Af Amer: >60 mL/min (ref >60)
GFR calc non Af Amer: >60 mL/min (ref >60)
GLUCOSE: 185 mg/dL — AB (ref 65–99)
Potassium: 3.7 mmol/L (ref 3.5–5.1)
Sodium: 134 mmol/L — ABNORMAL LOW (ref 135–145)

## 2016-02-09 LAB — ABO/RH: ABO/RH(D): O POS

## 2016-02-09 NOTE — Patient Instructions (Addendum)
[place]  02/09/2016   Your procedure is scheduled on:  02/10/2016  Enter through the Main Entrance of [facility] [place] up the phone at the desk and dial [phone].   Call this number if you have problems the morning of surgery: [phone]   Remember:   Do not eat food:After Midnight.  Do not drink clear liquids: 4 Hours before arrival.  Take these medicines the morning of surgery with A SIP OF WATER:  DO NOT TAKE LOVENOX    Do not wear jewelry, make-up or nail polish.  Do not wear lotions, powders, or perfumes. You may wear deodorant.  Do not shave 48 hours prior to surgery.  Do not bring valuables to the hospital.  [facility] is not   responsible for any belongings or valuables brought to the hospital.  Contacts, dentures or bridgework may not be worn into surgery.  Leave suitcase in the car. After surgery it may be brought to your room.  For patients admitted to the hospital, checkout time is 11:00 AM the day of              discharge.   Patients discharged the day of surgery will not be allowed to drive             home.  Name and phone number of your driver: NA  Special Instructions:   N/A 20 Zahriah NAKISA KRZEMIEN  02/09/2016   Your procedure is scheduled on:  02/10/2016  Enter through the Main Entrance of [facility] at [place] up the phone at the desk and dial [phone].   Call this number if you have problems the morning of surgery: [phone]   Remember:   Do not eat food:After Midnight.  Do not drink clear liquids: 4 Hours before arrival.  Take these medicines the morning of surgery with A SIP OF WATER: DO NOT TAKE LOVENOX   Do not wear jewelry, make-up or nail polish.  Do not wear lotions, powders, or perfumes. You may wear deodorant.  Do not shave 48 hours prior to surgery.  Do not bring valuables to the hospital.  [facility] is not   responsible for any belongings or valuables brought to the hospital.  Contacts, dentures or bridgework  may not be worn into surgery.  Leave suitcase in the car. After surgery it may be brought to your room.  For patients admitted to the hospital, checkout time is 11:00 AM the day of              discharge.   Patients discharged the day of surgery will not be allowed to drive             home.  Name and phone number of your driver: NA  Special Instructions:   Shower using CHG 2 nights before surgery and the night before surgery.  If you shower the day of surgery use CHG.  Use special wash - you have one bottle of CHG for all showers.  You should use approximately 1/3 of the bottle for each shower.   Please read over the following fact sheets that you were given:   Surgical Site Infection Prevention      Please read over the following fact sheets that you were given:   Surgical Site Infection Prevention

## 2016-02-10 ENCOUNTER — Inpatient Hospital Stay (HOSPITAL_COMMUNITY)
Admission: RE | Admit: 2016-02-10 | Discharge: 2016-02-14 | DRG: 765 | Disposition: A | Discharge status: Home / Self Care | Payer: BLUE CROSS/BLUE SHIELD | Source: Ambulatory Visit | Attending: Obstetrics and Gynecology | Admitting: Obstetrics and Gynecology

## 2016-02-10 ENCOUNTER — Encounter (HOSPITAL_COMMUNITY)
Admission: RE | Disposition: A | Discharge status: Home / Self Care | Payer: Self-pay | Source: Ambulatory Visit | Attending: Obstetrics and Gynecology

## 2016-02-10 ENCOUNTER — Inpatient Hospital Stay (HOSPITAL_COMMUNITY): Payer: BLUE CROSS/BLUE SHIELD | Admitting: Anesthesiology

## 2016-02-10 ENCOUNTER — Encounter (HOSPITAL_COMMUNITY): Payer: Self-pay | Admitting: Emergency Medicine

## 2016-02-10 DIAGNOSIS — Z3A37 37 weeks gestation of pregnancy: Secondary | ICD-10-CM

## 2016-02-10 DIAGNOSIS — E282 Polycystic ovarian syndrome: Secondary | ICD-10-CM | POA: Diagnosis present

## 2016-02-10 DIAGNOSIS — O1490 Unspecified pre-eclampsia, unspecified trimester: Secondary | ICD-10-CM | POA: Diagnosis present

## 2016-02-10 DIAGNOSIS — Z9221 Personal history of antineoplastic chemotherapy: Secondary | ICD-10-CM | POA: Diagnosis not present

## 2016-02-10 DIAGNOSIS — K219 Gastro-esophageal reflux disease without esophagitis: Secondary | ICD-10-CM | POA: Diagnosis present

## 2016-02-10 DIAGNOSIS — O9081 Anemia of the puerperium: Secondary | ICD-10-CM | POA: Diagnosis not present

## 2016-02-10 DIAGNOSIS — Z8249 Family history of ischemic heart disease and other diseases of the circulatory system: Secondary | ICD-10-CM | POA: Diagnosis not present

## 2016-02-10 DIAGNOSIS — D6859 Other primary thrombophilia: Secondary | ICD-10-CM | POA: Diagnosis present

## 2016-02-10 DIAGNOSIS — J45909 Unspecified asthma, uncomplicated: Secondary | ICD-10-CM | POA: Diagnosis present

## 2016-02-10 DIAGNOSIS — O9962 Diseases of the digestive system complicating childbirth: Secondary | ICD-10-CM | POA: Diagnosis present

## 2016-02-10 DIAGNOSIS — O24425 Gestational diabetes mellitus in childbirth, controlled by oral hypoglycemic drugs: Secondary | ICD-10-CM | POA: Diagnosis present

## 2016-02-10 DIAGNOSIS — O99284 Endocrine, nutritional and metabolic diseases complicating childbirth: Secondary | ICD-10-CM | POA: Diagnosis present

## 2016-02-10 DIAGNOSIS — E7212 Methylenetetrahydrofolate reductase deficiency: Secondary | ICD-10-CM | POA: Diagnosis present

## 2016-02-10 DIAGNOSIS — O1404 Mild to moderate pre-eclampsia, complicating childbirth: Secondary | ICD-10-CM | POA: Diagnosis present

## 2016-02-10 DIAGNOSIS — D62 Acute posthemorrhagic anemia: Secondary | ICD-10-CM | POA: Diagnosis not present

## 2016-02-10 DIAGNOSIS — O9912 Other diseases of the blood and blood-forming organs and certain disorders involving the immune mechanism complicating childbirth: Secondary | ICD-10-CM | POA: Diagnosis present

## 2016-02-10 DIAGNOSIS — D45 Polycythemia vera: Secondary | ICD-10-CM | POA: Diagnosis present

## 2016-02-10 DIAGNOSIS — O9952 Diseases of the respiratory system complicating childbirth: Secondary | ICD-10-CM | POA: Diagnosis present

## 2016-02-10 DIAGNOSIS — Z833 Family history of diabetes mellitus: Secondary | ICD-10-CM | POA: Diagnosis not present

## 2016-02-10 DIAGNOSIS — O3663X0 Maternal care for excessive fetal growth, third trimester, not applicable or unspecified: Secondary | ICD-10-CM | POA: Diagnosis present

## 2016-02-10 DIAGNOSIS — O403XX0 Polyhydramnios, third trimester, not applicable or unspecified: Secondary | ICD-10-CM | POA: Diagnosis present

## 2016-02-10 DIAGNOSIS — O3493 Maternal care for abnormality of pelvic organ, unspecified, third trimester: Principal | ICD-10-CM | POA: Diagnosis present

## 2016-02-10 LAB — COMPREHENSIVE METABOLIC PANEL
ALK PHOS: 112 U/L (ref 38–126)
ALT: 20 U/L (ref 14–54)
AST: 31 U/L (ref 15–41)
Albumin: 2.5 g/dL — ABNORMAL LOW (ref 3.5–5.0)
Anion gap: 7 (ref 5–15)
BILIRUBIN TOTAL: 0.4 mg/dL (ref 0.3–1.2)
BUN: 10 mg/dL (ref 6–20)
CALCIUM: 8.1 mg/dL — AB (ref 8.9–10.3)
CO2: 20 mmol/L — ABNORMAL LOW (ref 22–32)
CREATININE: 0.46 mg/dL (ref 0.44–1.00)
Chloride: 107 mmol/L (ref 101–111)
Glucose, Bld: 72 mg/dL (ref 65–99)
Potassium: 3.8 mmol/L (ref 3.5–5.1)
Sodium: 134 mmol/L — ABNORMAL LOW (ref 135–145)
TOTAL PROTEIN: 6.1 g/dL — AB (ref 6.5–8.1)

## 2016-02-10 LAB — PREPARE RBC (CROSSMATCH)

## 2016-02-10 LAB — GLUCOSE, CAPILLARY
GLUCOSE-CAPILLARY: 126 mg/dL — AB (ref 65–99)
Glucose-Capillary: 92 mg/dL (ref 65–99)
Glucose-Capillary: 73 mg/dL (ref 65–99)

## 2016-02-10 LAB — RPR: RPR: NONREACTIVE

## 2016-02-10 SURGERY — Surgical Case
Anesthesia: Spinal | Site: Abdomen | Wound class: Clean Contaminated

## 2016-02-10 MED ORDER — METHYLERGONOVINE MALEATE 0.2 MG PO TABS: 0.2000 mg | ORAL_TABLET | ORAL | Status: DC | PRN

## 2016-02-10 MED ORDER — SCOPOLAMINE 1 MG/3DAYS TD PT72: 1.0000 | MEDICATED_PATCH | Freq: Once | TRANSDERMAL | Status: DC

## 2016-02-10 MED ORDER — ONDANSETRON HCL 4 MG/2ML IJ SOLN
INTRAMUSCULAR | Status: AC
  Filled 2016-02-10: qty 2

## 2016-02-10 MED ORDER — MEPERIDINE HCL 25 MG/ML IJ SOLN
INTRAMUSCULAR | Status: AC
  Filled 2016-02-10: qty 1

## 2016-02-10 MED ORDER — KETOROLAC TROMETHAMINE 30 MG/ML IJ SOLN
30.0000 mg | Freq: Four times a day (QID) | INTRAMUSCULAR | Status: AC | PRN
Start: 2016-02-10 — End: 2016-02-11

## 2016-02-10 MED ORDER — NALBUPHINE HCL 10 MG/ML IJ SOLN
INTRAMUSCULAR | Status: AC
  Administered 2016-02-10: 5 mg via SUBCUTANEOUS
  Filled 2016-02-10: qty 1

## 2016-02-10 MED ORDER — DIPHENHYDRAMINE HCL 50 MG/ML IJ SOLN: 12.5000 mg | INTRAMUSCULAR | Status: DC | PRN

## 2016-02-10 MED ORDER — SCOPOLAMINE 1 MG/3DAYS TD PT72
MEDICATED_PATCH | TRANSDERMAL | Status: AC
  Administered 2016-02-10: 1.5 mg via TRANSDERMAL
  Filled 2016-02-10: qty 1

## 2016-02-10 MED ORDER — OXYTOCIN 10 UNIT/ML IJ SOLN
INTRAMUSCULAR | Status: AC
  Filled 2016-02-10: qty 4

## 2016-02-10 MED ORDER — NALOXONE HCL 0.4 MG/ML IJ SOLN: 0.4000 mg | INTRAMUSCULAR | Status: DC | PRN

## 2016-02-10 MED ORDER — NALBUPHINE HCL 10 MG/ML IJ SOLN
5.0000 mg | INTRAMUSCULAR | Status: DC | PRN
  Administered 2016-02-10: 5 mg via SUBCUTANEOUS

## 2016-02-10 MED ORDER — TETANUS-DIPHTH-ACELL PERTUSSIS 5-2.5-18.5 LF-MCG/0.5 IM SUSP: 0.5000 mL | Freq: Once | INTRAMUSCULAR | Status: DC

## 2016-02-10 MED ORDER — DIPHENHYDRAMINE HCL 25 MG PO CAPS: 25.0000 mg | ORAL_CAPSULE | ORAL | Status: DC | PRN

## 2016-02-10 MED ORDER — OXYCODONE HCL 5 MG PO TABS
ORAL_TABLET | ORAL | Status: AC
  Filled 2016-02-10: qty 1

## 2016-02-10 MED ORDER — BUPIVACAINE IN DEXTROSE 0.75-8.25 % IT SOLN
INTRATHECAL | Status: DC | PRN
  Administered 2016-02-10: 1.4 mL via INTRATHECAL

## 2016-02-10 MED ORDER — IBUPROFEN 600 MG PO TABS
600.0000 mg | ORAL_TABLET | Freq: Four times a day (QID) | ORAL | Status: DC
  Administered 2016-02-10 – 2016-02-14 (×15): 600 mg via ORAL
  Filled 2016-02-10 (×15): qty 1

## 2016-02-10 MED ORDER — METHYLERGONOVINE MALEATE 0.2 MG/ML IJ SOLN: 0.2000 mg | INTRAMUSCULAR | Status: DC | PRN

## 2016-02-10 MED ORDER — ONDANSETRON HCL 4 MG/2ML IJ SOLN
INTRAMUSCULAR | Status: DC | PRN
  Administered 2016-02-10: 4 mg via INTRAVENOUS

## 2016-02-10 MED ORDER — DIBUCAINE 1 % RE OINT: 1.0000 "application " | TOPICAL_OINTMENT | RECTAL | Status: DC | PRN

## 2016-02-10 MED ORDER — ACETAMINOPHEN 500 MG PO TABS
1000.0000 mg | ORAL_TABLET | Freq: Once | ORAL | Status: AC
  Administered 2016-02-10: 1000 mg via ORAL

## 2016-02-10 MED ORDER — FENTANYL CITRATE (PF) 100 MCG/2ML IJ SOLN
INTRAMUSCULAR | Status: DC | PRN
  Administered 2016-02-10: 10 ug via INTRATHECAL

## 2016-02-10 MED ORDER — MENTHOL 3 MG MT LOZG: 1.0000 | LOZENGE | OROMUCOSAL | Status: DC | PRN

## 2016-02-10 MED ORDER — LACTATED RINGERS IV SOLN
INTRAVENOUS | Status: DC | PRN
  Administered 2016-02-10: 14:00:00 via INTRAVENOUS

## 2016-02-10 MED ORDER — SCOPOLAMINE 1 MG/3DAYS TD PT72
1.0000 | MEDICATED_PATCH | Freq: Once | TRANSDERMAL | Status: DC
  Administered 2016-02-10: 1.5 mg via TRANSDERMAL

## 2016-02-10 MED ORDER — WITCH HAZEL-GLYCERIN EX PADS: 1.0000 "application " | MEDICATED_PAD | CUTANEOUS | Status: DC | PRN

## 2016-02-10 MED ORDER — SIMETHICONE 80 MG PO CHEW
80.0000 mg | CHEWABLE_TABLET | Freq: Three times a day (TID) | ORAL | Status: DC
  Administered 2016-02-11 – 2016-02-14 (×9): 80 mg via ORAL
  Filled 2016-02-10 (×9): qty 1

## 2016-02-10 MED ORDER — MORPHINE SULFATE (PF) 0.5 MG/ML IJ SOLN
INTRAMUSCULAR | Status: DC | PRN
  Administered 2016-02-10: .2 mg via INTRATHECAL

## 2016-02-10 MED ORDER — SODIUM CHLORIDE 0.9% FLUSH: 3.0000 mL | INTRAVENOUS | Status: DC | PRN

## 2016-02-10 MED ORDER — KETOROLAC TROMETHAMINE 30 MG/ML IJ SOLN
30.0000 mg | Freq: Four times a day (QID) | INTRAMUSCULAR | Status: AC | PRN
  Administered 2016-02-10: 30 mg via INTRAMUSCULAR

## 2016-02-10 MED ORDER — PHENYLEPHRINE 8 MG IN D5W 100 ML (0.08MG/ML) PREMIX OPTIME
INJECTION | INTRAVENOUS | Status: AC
  Filled 2016-02-10: qty 100

## 2016-02-10 MED ORDER — SENNOSIDES-DOCUSATE SODIUM 8.6-50 MG PO TABS
2.0000 | ORAL_TABLET | ORAL | Status: DC
  Administered 2016-02-10 – 2016-02-13 (×4): 2 via ORAL
  Filled 2016-02-10 (×4): qty 2

## 2016-02-10 MED ORDER — CEFAZOLIN SODIUM-DEXTROSE 2-4 GM/100ML-% IV SOLN
2.0000 g | INTRAVENOUS | Status: AC
  Administered 2016-02-10: 2 g via INTRAVENOUS
  Filled 2016-02-10: qty 100

## 2016-02-10 MED ORDER — LACTATED RINGERS IV SOLN
INTRAVENOUS | Status: DC
  Administered 2016-02-11: via INTRAVENOUS

## 2016-02-10 MED ORDER — NALOXONE HCL 2 MG/2ML IJ SOSY: 1.0000 ug/kg/h | PREFILLED_SYRINGE | INTRAMUSCULAR | Status: DC | PRN

## 2016-02-10 MED ORDER — ASPIRIN 81 MG PO CHEW
81.0000 mg | CHEWABLE_TABLET | Freq: Once | ORAL | Status: AC
Start: 2016-02-11 — End: 2016-02-10
  Administered 2016-02-10: 81 mg via ORAL
  Filled 2016-02-10: qty 1

## 2016-02-10 MED ORDER — OXYCODONE HCL 5 MG PO TABS
5.0000 mg | ORAL_TABLET | Freq: Once | ORAL | Status: AC
  Administered 2016-02-10: 5 mg via ORAL

## 2016-02-10 MED ORDER — ONDANSETRON HCL 4 MG/2ML IJ SOLN: 4.0000 mg | Freq: Three times a day (TID) | INTRAMUSCULAR | Status: DC | PRN

## 2016-02-10 MED ORDER — NALBUPHINE HCL 10 MG/ML IJ SOLN: 5.0000 mg | Freq: Once | INTRAMUSCULAR | Status: DC | PRN

## 2016-02-10 MED ORDER — FENTANYL CITRATE (PF) 100 MCG/2ML IJ SOLN
INTRAMUSCULAR | Status: AC
Start: 2016-02-10 — End: 2016-02-10
  Filled 2016-02-10: qty 2

## 2016-02-10 MED ORDER — MORPHINE SULFATE (PF) 0.5 MG/ML IJ SOLN
INTRAMUSCULAR | Status: AC
  Filled 2016-02-10: qty 10

## 2016-02-10 MED ORDER — BUPIVACAINE HCL (PF) 0.25 % IJ SOLN
INTRAMUSCULAR | Status: AC
  Filled 2016-02-10: qty 30

## 2016-02-10 MED ORDER — PANTOPRAZOLE SODIUM 40 MG PO TBEC
40.0000 mg | DELAYED_RELEASE_TABLET | Freq: Every day | ORAL | Status: DC
  Administered 2016-02-11 – 2016-02-13 (×4): 40 mg via ORAL
  Filled 2016-02-10 (×4): qty 1

## 2016-02-10 MED ORDER — MEPERIDINE HCL 25 MG/ML IJ SOLN: 6.2500 mg | INTRAMUSCULAR | Status: DC | PRN

## 2016-02-10 MED ORDER — ENOXAPARIN SODIUM 40 MG/0.4ML ~~LOC~~ SOLN
60.0000 mg | SUBCUTANEOUS | Status: DC
  Administered 2016-02-11 – 2016-02-14 (×4): 60 mg via SUBCUTANEOUS
  Filled 2016-02-10 (×5): qty 0.6

## 2016-02-10 MED ORDER — LACTATED RINGERS IV SOLN
INTRAVENOUS | Status: DC | PRN
  Administered 2016-02-10 (×2): via INTRAVENOUS

## 2016-02-10 MED ORDER — OXYTOCIN 10 UNIT/ML IJ SOLN: 2.5000 [IU]/h | INTRAVENOUS | Status: AC

## 2016-02-10 MED ORDER — LACTATED RINGERS IV SOLN
40.0000 [IU] | INTRAVENOUS | Status: DC | PRN
  Administered 2016-02-10: 40 [IU] via INTRAVENOUS

## 2016-02-10 MED ORDER — FOLIC ACID 1 MG PO TABS
4.0000 mg | ORAL_TABLET | Freq: Every day | ORAL | Status: DC
  Administered 2016-02-11 – 2016-02-14 (×4): 4 mg via ORAL
  Filled 2016-02-10 (×6): qty 4

## 2016-02-10 MED ORDER — MEPERIDINE HCL 25 MG/ML IJ SOLN
INTRAMUSCULAR | Status: DC | PRN
  Administered 2016-02-10 (×2): 12.5 mg via INTRAVENOUS

## 2016-02-10 MED ORDER — NALBUPHINE HCL 10 MG/ML IJ SOLN
5.0000 mg | INTRAMUSCULAR | Status: DC | PRN
Start: 2016-02-10 — End: 2016-02-14
  Filled 2016-02-10 (×2): qty 1

## 2016-02-10 MED ORDER — CEFAZOLIN SODIUM-DEXTROSE 2-3 GM-% IV SOLR
INTRAVENOUS | Status: AC
  Filled 2016-02-10: qty 50

## 2016-02-10 MED ORDER — PHENYLEPHRINE 8 MG IN D5W 100 ML (0.08MG/ML) PREMIX OPTIME
INJECTION | INTRAVENOUS | Status: DC | PRN
  Administered 2016-02-10: 30 ug/min via INTRAVENOUS

## 2016-02-10 MED ORDER — SIMETHICONE 80 MG PO CHEW
80.0000 mg | CHEWABLE_TABLET | ORAL | Status: DC
  Administered 2016-02-10 – 2016-02-13 (×4): 80 mg via ORAL
  Filled 2016-02-10 (×4): qty 1

## 2016-02-10 MED ORDER — NALBUPHINE HCL 10 MG/ML IJ SOLN
5.0000 mg | INTRAMUSCULAR | Status: DC | PRN
  Administered 2016-02-10 – 2016-02-11 (×2): 5 mg via INTRAVENOUS

## 2016-02-10 MED ORDER — BUPIVACAINE HCL (PF) 0.25 % IJ SOLN
INTRAMUSCULAR | Status: DC | PRN
  Administered 2016-02-10: 10 mL

## 2016-02-10 MED ORDER — NALOXONE HCL 2 MG/2ML IJ SOSY: 1.0000 ug/kg/h | PREFILLED_SYRINGE | INTRAVENOUS | Status: DC | PRN

## 2016-02-10 MED ORDER — SIMETHICONE 80 MG PO CHEW
80.0000 mg | CHEWABLE_TABLET | ORAL | Status: DC | PRN
  Administered 2016-02-13: 80 mg via ORAL

## 2016-02-10 MED ORDER — ZOLPIDEM TARTRATE 5 MG PO TABS: 5.0000 mg | ORAL_TABLET | Freq: Every evening | ORAL | Status: DC | PRN

## 2016-02-10 MED ORDER — COCONUT OIL OIL: 1.0000 "application " | TOPICAL_OIL | Status: DC | PRN

## 2016-02-10 MED ORDER — PRENATAL MULTIVITAMIN CH
1.0000 | ORAL_TABLET | Freq: Every day | ORAL | Status: DC
  Administered 2016-02-11 – 2016-02-14 (×4): 1 via ORAL
  Filled 2016-02-10 (×4): qty 1

## 2016-02-10 MED ORDER — KETOROLAC TROMETHAMINE 30 MG/ML IJ SOLN
INTRAMUSCULAR | Status: AC
  Administered 2016-02-10: 30 mg via INTRAMUSCULAR
  Filled 2016-02-10: qty 1

## 2016-02-10 MED ORDER — ACETAMINOPHEN 500 MG PO TABS
ORAL_TABLET | ORAL | Status: AC
  Filled 2016-02-10: qty 2

## 2016-02-10 MED ORDER — NALBUPHINE HCL 10 MG/ML IJ SOLN: 5.0000 mg | INTRAMUSCULAR | Status: DC | PRN

## 2016-02-10 MED ORDER — KETOROLAC TROMETHAMINE 30 MG/ML IJ SOLN: 30.0000 mg | Freq: Four times a day (QID) | INTRAMUSCULAR | Status: AC | PRN

## 2016-02-10 MED ORDER — ACETAMINOPHEN 325 MG PO TABS
650.0000 mg | ORAL_TABLET | ORAL | Status: DC | PRN
  Administered 2016-02-11 (×2): 650 mg via ORAL
  Filled 2016-02-10 (×2): qty 2

## 2016-02-10 MED ORDER — LACTATED RINGERS IV SOLN
Freq: Once | INTRAVENOUS | Status: AC
  Administered 2016-02-10: 12:00:00 via INTRAVENOUS

## 2016-02-10 MED ORDER — DIPHENHYDRAMINE HCL 25 MG PO CAPS: 25.0000 mg | ORAL_CAPSULE | Freq: Four times a day (QID) | ORAL | Status: DC | PRN

## 2016-02-10 SURGICAL SUPPLY — 36 items
CLAMP CORD UMBIL (MISCELLANEOUS) IMPLANT
CLOTH BEACON ORANGE TIMEOUT ST (SAFETY) ×2 IMPLANT
CONTAINER PREFILL 10% NBF 15ML (MISCELLANEOUS) IMPLANT
DRSG OPSITE POSTOP 4X10 (GAUZE/BANDAGES/DRESSINGS) ×2 IMPLANT
DURAPREP 26ML APPLICATOR (WOUND CARE) ×2 IMPLANT
ELECT REM PT RETURN 9FT ADLT (ELECTROSURGICAL) ×2
ELECTRODE REM PT RTRN 9FT ADLT (ELECTROSURGICAL) ×1 IMPLANT
EXTRACTOR VACUUM M CUP 4 TUBE (SUCTIONS) IMPLANT
GLOVE BIO SURGEON STRL SZ7.5 (GLOVE) ×2 IMPLANT
GLOVE BIOGEL PI IND STRL 7.0 (GLOVE) ×1 IMPLANT
GLOVE BIOGEL PI INDICATOR 7.0 (GLOVE) ×1
GOWN STRL REUS W/TWL LRG LVL3 (GOWN DISPOSABLE) ×4 IMPLANT
KIT ABG SYR 3ML LUER SLIP (SYRINGE) IMPLANT
NDL HYPO 25X5/8 SAFETYGLIDE (NEEDLE) IMPLANT
NDL SPNL 20GX3.5 QUINCKE YW (NEEDLE) IMPLANT
NEEDLE HYPO 22GX1.5 SAFETY (NEEDLE) ×2 IMPLANT
NEEDLE HYPO 25X5/8 SAFETYGLIDE (NEEDLE) IMPLANT
NEEDLE SPNL 20GX3.5 QUINCKE YW (NEEDLE) IMPLANT
NS IRRIG 1000ML POUR BTL (IV SOLUTION) ×2 IMPLANT
PACK C SECTION WH (CUSTOM PROCEDURE TRAY) ×2 IMPLANT
PAD ABD 8X10 STRL (GAUZE/BANDAGES/DRESSINGS) ×1 IMPLANT
PENCIL SMOKE EVAC W/HOLSTER (ELECTROSURGICAL) ×2 IMPLANT
SPONGE GAUZE 4X4 12PLY (GAUZE/BANDAGES/DRESSINGS) ×2 IMPLANT
SUT MNCRL 0 VIOLET CTX 36 (SUTURE) ×2 IMPLANT
SUT MNCRL AB 3-0 PS2 27 (SUTURE) IMPLANT
SUT MON AB 2-0 CT1 27 (SUTURE) ×2 IMPLANT
SUT MON AB-0 CT1 36 (SUTURE) ×4 IMPLANT
SUT MONOCRYL 0 CTX 36 (SUTURE) ×2
SUT PLAIN 0 NONE (SUTURE) IMPLANT
SUT PLAIN 2 0 (SUTURE)
SUT PLAIN 2 0 XLH (SUTURE) ×1 IMPLANT
SUT PLAIN ABS 2-0 CT1 27XMFL (SUTURE) IMPLANT
SYR 20CC LL (SYRINGE) IMPLANT
SYR CONTROL 10ML LL (SYRINGE) ×2 IMPLANT
TOWEL OR 17X24 6PK STRL BLUE (TOWEL DISPOSABLE) ×2 IMPLANT
TRAY FOLEY CATH SILVER 14FR (SET/KITS/TRAYS/PACK) ×2 IMPLANT

## 2016-02-10 NOTE — Progress Notes (Signed)
Patient ID: Leslie Velasquez, female   DOB: [birth year], 45 y.o.   MRN: [id number] Patient seen and examined. Consent witnessed and signed. No changes noted. Update completed.

## 2016-02-10 NOTE — Progress Notes (Signed)
Notified CNM Mel Almond regarding elevated BPs.  She said to notified them if BP is greater than 180/100.  Will continue to monitor.

## 2016-02-10 NOTE — Anesthesia Postprocedure Evaluation (Signed)
Anesthesia Post Note  Patient: Leslie Velasquez  Procedure(s) Performed: Procedure(s) (LRB): Primary CESAREAN SECTION (N/A)  Patient location during evaluation: PACU Anesthesia Type: Spinal and MAC Level of consciousness: awake and alert Pain management: pain level controlled Vital Signs Assessment: post-procedure vital signs reviewed and stable Respiratory status: spontaneous breathing and respiratory function stable Cardiovascular status: blood pressure returned to baseline and stable Postop Assessment: spinal receding Anesthetic complications: no     Last Vitals:  Filed Vitals:   02/10/16 1530 02/10/16 1545  BP: 156/83 151/90  Pulse: 84 82  Temp:    Resp: 20 21    Last Pain:  Filed Vitals:   02/10/16 1549  PainSc: 3    Pain Goal: Patients Stated Pain Goal: 0 (02/10/16 1435)               [name]

## 2016-02-10 NOTE — Lactation Note (Signed)
This note was copied from a baby's chart. Lactation Consultation Note Initial visit at 5 hours of age.  Mom reports baby just fed, when asked how long mom said 1 minute.  Baby on moms chest with blankets.  Offered to assist with latch and mom declined.  Chart indicated mom was taking interferon medication.  Mom had visitors in room so this was not discussed directly, but when [facility] asked if mom was taking any meds she wanted me to look at in drug book, she said no.  Peds aware in charted note.  Baby became very fussy with arching and kicking and didn't calm for mom.  LC assisted with repositioning STS and expressed a drop to apply to lips and attempted latch.  Baby did calm with breast at mouth, but only took a few sucks and remained fussy on and off.  Baby does not appear eager to eat at this time.  Diaper checked again and this time wet with smear of meconium noted.  Encouraged FOB to change diaper and mom to offer STS after diaper change and latch if baby desires.  Mom may need help with positioning due to recovery from c/s.  Mom is tired and not very engaged with visit at this time.  FOB quiet at bedside.  LC reported to RN who is aware.  [facility] LC resources given and discussed.  Encouraged to feed with early cues on demand.  Early newborn behavior discussed.  Hand expression demonstrated with colostrum visible.  Mom to call for assist as needed.     Patient Name: Girl Kassadee Laramore [id number] Date: 02/10/2016 Reason for consult: Initial assessment   Maternal Data Has patient been taught Hand Expression?: Yes Does the patient have breastfeeding experience prior to this delivery?: No  Feeding Feeding Type: Breast Fed Length of feed:  (few sucks)  LATCH Score/Interventions Latch: Repeated attempts needed to sustain latch, nipple held in mouth throughout feeding, stimulation needed to elicit sucking reflex. Intervention(s): Adjust position;Assist with latch;Breast massage;Breast compression  Audible  Swallowing: None Intervention(s): Skin to skin;Hand expression  Type of Nipple: Everted at rest and after stimulation  Comfort (Breast/Nipple): Soft / non-tender     Hold (Positioning): Full assist, staff holds infant at breast Intervention(s): Breastfeeding basics reviewed;Support Pillows;Position options;Skin to skin  LATCH Score: 5  Lactation Tools Discussed/Used     Consult Status Consult Status: Follow-up Date: 02/11/16 Follow-up type: In-patient    Shoptaw, Justine Null 02/10/2016, 7:06 PM

## 2016-02-10 NOTE — Op Note (Signed)
Cesarean Section Procedure Note  Indications: macrosomia, previous myomectomy and PEC without severe features  Pre-operative Diagnosis: 37 week 1 day pregnancy.  Post-operative Diagnosis: same  Surgeon: Lovenia Kim   Assistants: Janann Colonel, CNM  Anesthesia: Local anesthesia 0.25.% bupivacaine and Spinal anesthesia  ASA Class: 2  Procedure Details  The patient was seen in the Holding Room. The risks, benefits, complications, treatment options, and expected outcomes were discussed with the patient.  The patient concurred with the proposed plan, giving informed consent. The risks of anesthesia, infection, bleeding and possible injury to other organs discussed. Injury to bowel, bladder, or ureter with possible need for repair discussed. Possible need for transfusion with secondary risks of hepatitis or HIV acquisition discussed. Post operative complications to include but not limited to DVT, PE and Pneumonia noted. The site of surgery properly noted/marked. The patient was taken to Operating Room # 9, identified as Leslie Velasquez and the procedure verified as C-Section Delivery. A Time Out was held and the above information confirmed.  After induction of anesthesia, the patient was draped and prepped in the usual sterile manner. A Pfannenstiel incision was made and carried down through the subcutaneous tissue to the fascia. Fascial incision was made and extended transversely using Mayo scissors. The fascia was separated from the underlying rectus tissue superiorly and inferiorly. The peritoneum was identified and entered. Peritoneal incision was extended longitudinally. The utero-vesical peritoneal reflection was incised transversely and the bladder flap was bluntly freed from the lower uterine segment. A low transverse uterine incision(Kerr hysterotomy) was made. Delivered from OT presentation was a  female with Apgar scores of 9 at one minute and 9 at five minutes. Bulb suctioning gently  performed. Neonatal team in attendance.After the umbilical cord was clamped and cut cord blood was obtained for evaluation. The placenta was removed intact and appeared normal. The uterus was curetted with a dry lap pack. Good hemostasis was noted.The uterine outline, tubes and ovaries appeared normal. The uterine incision was closed with running locked sutures of 0 Monocryl x 2 layers. Hemostasis was observed. Lavage was carried out until clear.The parietal peritoneum was closed with a running 2-0 Monocryl suture. The fascia was then reapproximated with running sutures of 0 Monocryl. The skin was reapproximated with 3-0 monocryl after [place] closure with 2-0 plain.  Instrument, sponge, and needle counts were correct prior the abdominal closure and at the conclusion of the case.   Findings: FTLF, OT, anterior placenta, post fibroid, nl adnexa  Estimated Blood Loss:  600         Drains: foley                 Specimens: placenta                 Complications:  None; patient tolerated the procedure well.         Disposition: PACU - hemodynamically stable.         Condition: stable  Attending Attestation: I performed the procedure.

## 2016-02-10 NOTE — Anesthesia Procedure Notes (Signed)
Spinal Patient location during procedure: OR Staffing Anesthesiologist: Suzette Battiest Performed by: anesthesiologist  Preanesthetic Checklist Completed: patient identified, site marked, surgical consent, pre-op evaluation, timeout performed, IV checked, risks and benefits discussed and monitors and equipment checked Spinal Block Patient position: sitting Prep: site prepped and draped and DuraPrep Patient monitoring: heart rate, continuous pulse ox and blood pressure Approach: midline Location: L3-4 Injection technique: single-shot Needle Needle type: Pencan  Needle gauge: 24 G Needle length: 9 cm Assessment Sensory level: T6

## 2016-02-10 NOTE — Transfer of Care (Signed)
Immediate Anesthesia Transfer of Care Note  Patient: Leslie Velasquez  Procedure(s) Performed: Procedure(s) with comments: Primary CESAREAN SECTION (N/A) - EDD: 03/02/16  Patient Location: PACU  Anesthesia Type:Spinal  Level of Consciousness: awake, alert  and oriented  Airway & Oxygen Therapy: Patient Spontanous Breathing  Post-op Assessment: Report given to RN and Post -op Vital signs reviewed and stable  Post vital signs: Reviewed and stable  Last Vitals:  Filed Vitals:   02/10/16 1139  BP: 169/98  Pulse: 95  Temp: 36.8 C  Resp: 16    Last Pain: There were no vitals filed for this visit.    Patients Stated Pain Goal: 3 ([id number])  Complications: No apparent anesthesia complications

## 2016-02-10 NOTE — Anesthesia Preprocedure Evaluation (Addendum)
Anesthesia Evaluation  Patient identified by MRN, date of birth, ID band Patient awake    Reviewed: Allergy & Precautions, NPO status , Patient's Chart, lab work & pertinent test results  Airway Mallampati: II  TM Distance: >3 FB     Dental   Pulmonary asthma ,    Pulmonary exam normal        Cardiovascular negative cardio ROS Normal cardiovascular exam     Neuro/Psych negative neurological ROS     GI/Hepatic Neg liver ROS, GERD  ,  Endo/Other  diabetes, Gestational  Renal/GU negative Renal ROS     Musculoskeletal   Abdominal   Peds  Hematology  (+) Blood dyscrasia (Polycythemia vera), ,   Anesthesia Other Findings   Reproductive/Obstetrics (+) Pregnancy Primary c/s for breech and hx of myomectomy.                            Lab Results  Component Value Date   WBC 6.6 02/09/2016   HGB 9.8* 02/09/2016   HCT 29.6* 02/09/2016   MCV 90.2 02/09/2016   PLT 249 02/09/2016   Lab Results  Component Value Date   CREATININE 0.56 02/09/2016   BUN 10 02/09/2016   NA 134* 02/09/2016   K 3.7 02/09/2016   CL 107 02/09/2016   CO2 19* 02/09/2016    Anesthesia Physical Anesthesia Plan  ASA: III  Anesthesia Plan: Spinal   Post-op Pain Management:    Induction:   Airway Management Planned: Natural Airway  Additional Equipment:   Intra-op Plan:   Post-operative Plan:   Informed Consent: I have reviewed the patients History and Physical, chart, labs and discussed the procedure including the risks, benefits and alternatives for the proposed anesthesia with the patient or authorized representative who has indicated his/her understanding and acceptance.     Plan Discussed with: CRNA and Surgeon  Anesthesia Plan Comments:         Anesthesia Quick Evaluation

## 2016-02-10 NOTE — H&P (Signed)
Leslie Velasquez is a 45 y.o. female presenting for primary csection for history of myomectomy, EFW 4500gm with DM and PEC without severe features.  Maternal Medical History:  Reason for admission: Nausea.  Fetal activity: Perceived fetal activity is normal.   Last perceived fetal movement was within the past hour.    Prenatal complications: Polyhydramnios and thrombophilia.   Prenatal Complications - Diabetes: gestational. Diabetes is managed by oral agent (monotherapy).      OB History    Gravida Para Term Preterm AB TAB SAB Ectopic Multiple Living   3    2  2         Past Medical History  Diagnosis Date  . Asthma   . GERD (gastroesophageal reflux disease)   . 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 Cancer ([place])     polycythemia vera cancer  . Polycythemia vera ([place])   . Personal history of chemotherapy     currently every Monday  . Gestational diabetes mellitus (GDM), antepartum   . 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 Polycythemia vera ([place]) 02/02/2016  . 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 Pregnancy, supervision, high-risk 02/02/2016  . Hx of pyelonephritis   . Gestational diabetes 02/02/2016    glyburide   Past Surgical History  Procedure Laterality Date  . Myomectomy    . Tonsillectomy    . Wisdom tooth extraction    . Colonoscopy    . Dilation and evacuation N/A 01/07/2015    Procedure: DILATATION AND EVACUATION with Tissue Sent For Chromosome Analysis;  Surgeon: Brien Few, MD;  Location: [facility];  Service: Gynecology;  Laterality: N/A;   Family History: family history includes Cancer in her father, paternal grandfather, and paternal grandmother; Diabetes in her father and mother; Hyperlipidemia in her father and mother; Hypertension in her father and mother. There is no history of Alcohol abuse, Arthritis, Asthma, Birth defects, COPD, Drug abuse, Depression, Early death, Hearing loss, Heart disease,  Kidney disease, Learning disabilities, Mental illness, Mental retardation, Miscarriages / Stillbirths, Stroke, Vision loss, or Varicose Veins. Social History:  reports that she has never smoked. She has never used smokeless tobacco. She reports that she does not drink alcohol or use illicit drugs.   Prenatal Transfer Tool  Maternal Diabetes: Yes:  Diabetes Type:  Insulin/Medication controlled Genetic Screening: Normal Maternal Ultrasounds/Referrals: Normal Fetal Ultrasounds or other Referrals:  None Maternal Substance Abuse:  No Significant Maternal Medications:  None Significant Maternal Lab Results:  None Other Comments:  None  Review of Systems  Constitutional: Negative.   Eyes: Negative for blurred vision, double vision and photophobia.  Gastrointestinal: Negative for heartburn and nausea.  Neurological: Negative for headaches.  All other systems reviewed and are negative.     Last menstrual period 05/26/2015. Exam Physical Exam  Nursing note and vitals reviewed. Constitutional: She is oriented to person, place, and time. She appears well-developed and well-nourished.  HENT:  Head: Normocephalic and atraumatic.  Neck: Normal range of motion. Neck supple.  Cardiovascular: Normal rate and regular rhythm.   Respiratory: Effort normal and breath sounds normal.  GI: Soft. Bowel sounds are normal.  Genitourinary: Vagina normal and uterus normal.  Musculoskeletal: Normal range of motion.  Neurological: She is alert and oriented to person, place, and time. She has normal reflexes.  Skin: Skin is warm and dry.  Psychiatric: She has a normal mood and affect.    Prenatal labs: ABO, Rh: --/--/O POS,  O POS (05/01 1245) Antibody: NEG (05/01 1245) Rubella: Immune (10/27 0000) RPR: Nonreactive (10/27 0000)  HBsAg: Negative (10/27 0000)  HIV: Non-reactive (10/27 0000)  GBS:     Assessment/Plan: 37 weeks PEC History of myomectomy Polycythemia Vera Primary csection. Consent  done. Risks vs benefits discussed.   [name] 02/10/2016, 6:35 AM

## 2016-02-11 ENCOUNTER — Encounter (HOSPITAL_COMMUNITY): Payer: Self-pay | Admitting: *Deleted

## 2016-02-11 DIAGNOSIS — D62 Acute posthemorrhagic anemia: Secondary | ICD-10-CM

## 2016-02-11 LAB — CBC
HCT: 25.3 % — ABNORMAL LOW (ref 36.0–46.0)
Hemoglobin: 8.4 g/dL — ABNORMAL LOW (ref 12.0–15.0)
MCH: 30.1 pg (ref 26.0–34.0)
MCHC: 33.2 g/dL (ref 30.0–36.0)
MCV: 90.7 fL (ref 78.0–100.0)
PLATELETS: 220 10*3/uL (ref 150–400)
RBC: 2.79 MIL/uL — AB (ref 3.87–5.11)
RDW: 16.3 % — AB (ref 11.5–15.5)
WBC: 5.2 10*3/uL (ref 4.0–10.5)

## 2016-02-11 LAB — GLUCOSE, CAPILLARY
GLUCOSE-CAPILLARY: 155 mg/dL — AB (ref 65–99)
GLUCOSE-CAPILLARY: 124 mg/dL — AB (ref 65–99)
Glucose-Capillary: 192 mg/dL — ABNORMAL HIGH (ref 65–99)

## 2016-02-11 MED ORDER — LABETALOL HCL 100 MG PO TABS
100.0000 mg | ORAL_TABLET | Freq: Two times a day (BID) | ORAL | Status: DC
  Administered 2016-02-11 – 2016-02-12 (×3): 100 mg via ORAL
  Filled 2016-02-11 (×3): qty 1

## 2016-02-11 MED ORDER — ASPIRIN 81 MG PO CHEW
81.0000 mg | CHEWABLE_TABLET | Freq: Every day | ORAL | Status: DC
  Administered 2016-02-11 – 2016-02-13 (×3): 81 mg via ORAL
  Filled 2016-02-11 (×4): qty 1

## 2016-02-11 MED ORDER — POLYSACCHARIDE IRON COMPLEX 150 MG PO CAPS
150.0000 mg | ORAL_CAPSULE | Freq: Every day | ORAL | Status: DC
  Administered 2016-02-11 – 2016-02-12 (×2): 150 mg via ORAL
  Filled 2016-02-11 (×2): qty 1

## 2016-02-11 MED ORDER — OXYCODONE-ACETAMINOPHEN 5-325 MG PO TABS
1.0000 | ORAL_TABLET | ORAL | Status: DC | PRN
  Administered 2016-02-11: 1 via ORAL
  Administered 2016-02-11: 2 via ORAL
  Administered 2016-02-11 – 2016-02-14 (×15): 1 via ORAL
  Filled 2016-02-11 (×4): qty 1
  Filled 2016-02-11: qty 2
  Filled 2016-02-11 (×12): qty 1

## 2016-02-11 NOTE — Addendum Note (Signed)
Addendum  created 02/11/16 0834 by Talbot Grumbling, CRNA   Modules edited: Clinical Notes   Clinical Notes:  File: [id number]

## 2016-02-11 NOTE — Progress Notes (Signed)
Patient ID: Leslie Velasquez, female   DOB: July 31, 1971, 45 y.o.   MRN: [id number] POD # 1  Subjective: Pt reports feeling well / Mod pain control with ibuprofen.  Has not taken Percocet Denies HA, visual disturbances, epigastric pain Tolerating po/ Foley d/c'ed /Voiding without problems/ No n/v/Flatus pos Activity: out of bed and ambulate Bleeding is spotting Newborn info:  Information for the patient's newborn:  Taleen, Dickard [id number]  female Feeding: breast   Objective: VS: Blood pressure 146/72, pulse 89, temperature 98.2 F (36.8 C), temperature source Oral, resp. rate 18.    Intake/Output Summary (Last 24 hours) at 02/11/16 0943 Last data filed at 02/11/16 0615  Gross per 24 hour  Intake 3287.49 ml  Output   6050 ml  Net -2762.51 ml      Recent Labs  02/09/16 1245 02/11/16 0512  WBC 6.6 5.2  HGB 9.8* 8.4*  HCT 29.6* 25.3*  PLT 249 220    Blood type: O POS Rubella: Immune    Physical Exam:  General: alert, cooperative and no distress CV: Regular rate and rhythm Resp: clear Abdomen: soft, nontender, normal bowel sounds Incision: covered with pressure dressing, C/D/I Uterine Fundus: firm, below umbilicus, nontender Lochia: minimal Ext: edema +2 pedal and pretib    A/P: POD # 1 G3P1021 S/P C/Section d/t mild PEC  BP's labile, will add labetalol PCOS.  Cont metformin and monitor glucose ABL anemia, adding fe supp today MTHFR. Cont baby ASA and lovenox Doing well Continue routine post op orders   Signed: [place], [facility] 02/11/2016, 9:43 AM

## 2016-02-11 NOTE — Lactation Note (Signed)
This note was copied from a baby's chart. Lactation Consultation Note  Baby 25 hours of age and has not has a successful feed yet. Reviewed hand expression and was able to express slight drops of colostrum. Attempted latching in various positions and baby mouthed nipple but did not suck. Applied #24NS to R side with no sucks. Set up DEBP and had mother pump for 15 min .  Drops expressed Reviewed cleaning and milk storage. Undressed baby for feeding and applied #24NS to L side and prefilled with Alimentum. Sucks and swallows observed although baby was still sleepy at the breast and needed lots of stimulation to keep feeding. When baby came off refilled NS and baby latched again and took approx 8 ml total through NS. Demonstrated with 14ml of formula how to finger syringe feed in case baby does not latch. Plan is for mother to trying latching baby and apply NS if needed.  Prefill or finger syringe feed until mother is pumping volume. Reviewed volume guidelines. Parents may need further instruction with plan.       Patient Name: Leslie Velasquez [id number] Date: 02/11/2016 Reason for consult: Initial assessment   Maternal Data Has patient been taught Hand Expression?: Yes Does the patient have breastfeeding experience prior to this delivery?: No  Feeding Feeding Type: Breast Fed Length of feed: 4 min  LATCH Score/Interventions                      Lactation Tools Discussed/Used     Consult Status Consult Status: Follow-up Date: 02/12/16 Follow-up type: In-patient    Vivianne Master [facility] 02/11/2016, 3:28 PM

## 2016-02-11 NOTE — Anesthesia Postprocedure Evaluation (Signed)
Anesthesia Post Note  Patient: Leslie Velasquez  Procedure(s) Performed: Procedure(s) (LRB): Primary CESAREAN SECTION (N/A)  Patient location during evaluation: Mother Baby Anesthesia Type: Spinal Level of consciousness: awake and alert and oriented Pain management: pain level controlled Vital Signs Assessment: post-procedure vital signs reviewed and stable Respiratory status: spontaneous breathing Cardiovascular status: blood pressure returned to baseline Postop Assessment: no headache, no backache, spinal receding, patient able to bend at knees, no signs of nausea or vomiting and adequate PO intake Anesthetic complications: no     Last Vitals:  Filed Vitals:   02/11/16 0206 02/11/16 0612  BP: 153/82 146/88  Pulse: 87 87  Temp: 36.8 C 36.7 C  Resp: 20 20    Last Pain:  Filed Vitals:   02/11/16 0644  PainSc: 4    Pain Goal: Patients Stated Pain Goal: 0 (02/10/16 1435)               Netanel Yannuzzi

## 2016-02-12 LAB — GLUCOSE, CAPILLARY
GLUCOSE-CAPILLARY: 142 mg/dL — AB (ref 65–99)
GLUCOSE-CAPILLARY: 143 mg/dL — AB (ref 65–99)
GLUCOSE-CAPILLARY: 155 mg/dL — AB (ref 65–99)

## 2016-02-12 MED ORDER — LABETALOL HCL 100 MG PO TABS
100.0000 mg | ORAL_TABLET | Freq: Three times a day (TID) | ORAL | Status: DC
  Administered 2016-02-12 – 2016-02-14 (×6): 100 mg via ORAL
  Filled 2016-02-12 (×6): qty 1

## 2016-02-12 MED ORDER — POLYSACCHARIDE IRON COMPLEX 150 MG PO CAPS
150.0000 mg | ORAL_CAPSULE | Freq: Two times a day (BID) | ORAL | Status: DC
Start: 2016-02-12 — End: 2016-02-14
  Administered 2016-02-12 – 2016-02-14 (×4): 150 mg via ORAL
  Filled 2016-02-12 (×4): qty 1

## 2016-02-12 MED ORDER — MAGNESIUM OXIDE 400 (241.3 MG) MG PO TABS
400.0000 mg | ORAL_TABLET | Freq: Every day | ORAL | Status: DC
  Administered 2016-02-12 – 2016-02-14 (×3): 400 mg via ORAL
  Filled 2016-02-12 (×4): qty 1

## 2016-02-12 NOTE — Progress Notes (Signed)
Patient ID: Leslie Velasquez, female   DOB: [birth year], 45 y.o.   MRN: [id number] Subjective: S/P Primary Cesarean Delivery d/t Macrosomia / Previous Myomectomy / PEC  POD# 2 Information for the patient's newborn:  Fahmida, Mergel [id number]  female    Reports feeling very sore. Feeding: breast Patient reports tolerating PO.  Breast symptoms: none Pain controlled with ibuprofen (OTC) and narcotic analgesics including Percocet Denies HA/SOB/C/P/N/V/dizziness. Flatus present. No BM. She reports vaginal bleeding as normal, without clots.  She is ambulating, urinating without difficult.     Objective:   VS:  Filed Vitals:   02/11/16 1843 02/11/16 2128 02/11/16 2245 02/12/16 0500  BP: 144/70 155/72 142/74 141/74  Pulse: 88 97 84 89  Temp: 98.4 F (36.9 C)   98 F (36.7 C)  TempSrc:    Oral  Resp: 18   18  SpO2:          Intake/Output Summary (Last 24 hours) at 02/12/16 0931 Last data filed at 02/11/16 1055  Gross per 24 hour  Intake      0 ml  Output    600 ml  Net   -600 ml         Recent Labs  02/09/16 1245 02/11/16 0512  WBC 6.6 5.2  HGB 9.8* 8.4*  HCT 29.6* 25.3*  PLT 249 220     Blood type: O POS (05/01 1245)  Rubella: Immune (10/27 0000)     Physical Exam:   General: alert, cooperative, fatigued, no distress and mildly obese  Abdomen: soft, nontender, normal bowel sounds  Incision: clean, dry, intact and pressure dressing loosley covering [place]  Uterine Fundus: firm, 2 FB below umbilicus, nontender  Lochia: minimal  Ext: edema trace, Homans sign is negative, no sign of DVT and TED hose in place   Assessment/Plan: 45 y.o.   POD# 2.  S/P Cesarean Delivery.  Indications: macrosomia, previous myomectomy, PEC                Principal Problem:   Postpartum care following cesarean delivery (02/10/16) Active Problems:   Acute blood loss anemia   Doing well, stable.               Regular diet as tolerated Change Labetalol 100 mg BID to TID Ambulate in  hallway 2-3 times today Routine post-op care Anticipate D/C home tomorrow  [name], M, MSN, CNM 02/12/2016, 8:28 AM

## 2016-02-12 NOTE — Lactation Note (Signed)
This note was copied from a baby's chart. Lactation Consultation Note  Patient Name: Leslie Velasquez [id number] Date: 02/12/2016 Reason for consult: Follow-up assessment Baby at 22 hr of life and mom stated bf is not going well. She is using the curved tip syring to fill the NS before latching and when baby takes all the formula from the NS they take her off and fill it up again until all 10 ml is gone. Reviewed supplementing guidelines by age. Mom has DEBP set up in the room but has not used in the last 24 hr because she only pumped drops yesterday. She can manually express large drops today. Mom stated she does not want to give formula, suggested she try just bf for the next 2 feedings and see how it goes. If she feels like baby needs to continue with the supplement she she could move to a SNS or start offering bottles when baby is done at the breast. Encouraged her to post pump after every feeding and offer her milk. She is aware of lactation services and will call for support.     Maternal Data    Feeding Feeding Type: Breast Fed  LATCH Score/Interventions Latch: Repeated attempts needed to sustain latch, nipple held in mouth throughout feeding, stimulation needed to elicit sucking reflex. Intervention(s): Adjust position;Breast compression;Assist with latch  Audible Swallowing: A few with stimulation Intervention(s): Skin to skin;Hand expression Intervention(s): Alternate breast massage  Type of Nipple: Everted at rest and after stimulation  Comfort (Breast/Nipple): Soft / non-tender     Hold (Positioning): Assistance needed to correctly position infant at breast and maintain latch. Intervention(s): Position options;Support Pillows  LATCH Score: 7  Lactation Tools Discussed/Used Tools: Nipple Jefferson Fuel;Other (comment) (curved tip syring) Nipple shield size: 24   Consult Status Consult Status: Follow-up Date: 02/13/16 Follow-up type: In-patient    Denzil Hughes 02/12/2016, 7:54 PM

## 2016-02-13 LAB — GLUCOSE, CAPILLARY
GLUCOSE-CAPILLARY: 105 mg/dL — AB (ref 65–99)
GLUCOSE-CAPILLARY: 149 mg/dL — AB (ref 65–99)
Glucose-Capillary: 142 mg/dL — ABNORMAL HIGH (ref 65–99)

## 2016-02-13 LAB — TYPE AND SCREEN
ABO/RH(D): O POS
ANTIBODY SCREEN: NEGATIVE
UNIT DIVISION: 0
Unit division: 0
Unit division: 0
Unit division: 0

## 2016-02-13 MED ORDER — HYDROCHLOROTHIAZIDE 25 MG PO TABS
25.0000 mg | ORAL_TABLET | Freq: Every day | ORAL | Status: DC
  Administered 2016-02-13 – 2016-02-14 (×2): 25 mg via ORAL
  Filled 2016-02-13 (×3): qty 1

## 2016-02-13 NOTE — Lactation Note (Addendum)
This note was copied from a baby's chart. Lactation Consultation Note  Patient Name: Leslie Velasquez [id number] Date: 02/13/2016 Reason for consult: Follow-up assessment  Baby 33 hours old. LC called to assist with latch after lingual frenulum release procedure. Baby also has a tighter upper labial frenulum and has difficulty keeping upper lip flanged while suckling. Mom has everted nipples with easily compressible and expressible breasts. Assisted mom to latch baby directly to right breast in football position. Attempted several times to latch, but baby not able to maintain a deep latch and would only suckle a few times before pulling off. Assisted to latch baby using #24 NS, but baby would not sustain a latch or suckle more than a couple of times with stimulation. Prefilled the NS with Alimentum using the curve tipped syringe, and baby would chew until shield empty. Set mom up with SNS (#5 [place] feeding tube and syringe) and baby slowly took 10 ml of Alimentum (15 ml was offered). Baby tolerated feeding well, but did not suckle with full jaw excursion at any point during the feeding. Mom post-pumped and was able to obtain about 2 ml of EBM.    Plan is for mom to put baby to breast with cues, and at least every 3 hours. Enc supplementing baby either at breast with NS and SNS system, or having FOB finger feed with curve-tipped syringe to enc the tongue to stretch as much as possible. Also discussed using Dr. Saul Fordyce with wide-based nipple at home as supplementation amounts increase. Reviewed supplementation guidelines and enc increasing the amount of supplementation offered over the next few feedings to 25-30 ml. Enc mom to post-pump for 15 minutes after each feeding followed by hand expression.   Parents given "Tongue and Lip Tie Resources" handout. Parents aware of OP/BFSG and [place] phone line assistance after D/C. Discussed assessment and interventions with Dr. Earle Gell and patient's bedside nurse, Gilmore Laroche, RN.      Maternal Data    Feeding Feeding Type: Formula Length of feed: 0 min  LATCH Score/Interventions Latch: Repeated attempts needed to sustain latch, nipple held in mouth throughout feeding, stimulation needed to elicit sucking reflex. Intervention(s): Adjust position;Assist with latch  Audible Swallowing: A few with stimulation Intervention(s): Skin to skin;Hand expression  Type of Nipple: Everted at rest and after stimulation  Comfort (Breast/Nipple): Soft / non-tender     Hold (Positioning): Assistance needed to correctly position infant at breast and maintain latch. Intervention(s): Breastfeeding basics reviewed;Support Pillows;Position options;Skin to skin  LATCH Score: 7  Lactation Tools Discussed/Used Tools: Nipple Shields Nipple shield size: 20   Consult Status Consult Status: Follow-up Date: 02/14/16 Follow-up type: In-patient    Inocente Salles 02/13/2016, 2:10 PM

## 2016-02-13 NOTE — Progress Notes (Addendum)
Patient ID: Leslie Velasquez, female   DOB: April 28, 1971, 45 y.o.   MRN: [id number] Subjective: S/P Primary Cesarean Delivery d/t Macrosomia / Previous Myomectomy / PEC  POD# 3 Information for the patient's newborn:  Kattie, Pink [id number]  female    Reports feeling very sore. Feeding: breast Patient reports tolerating PO.  Breast symptoms: none Pain controlled with ibuprofen (OTC) and narcotic analgesics including Percocet Denies HA/SOB/C/P/N/V/dizziness. Flatus present. No BM. C/O "very swollen and painful legs" She reports vaginal bleeding as normal, without clots.  She is ambulating, urinating without difficult.     Objective:   VS:  Filed Vitals:   02/12/16 2136 02/13/16 0643 02/13/16 1003 02/13/16 1412  BP: 144/83 154/75 157/81 163/84  Pulse:  90 98 84  Temp:  98.1 F (36.7 C)    TempSrc:  Oral    Resp:  18    SpO2:         No intake or output data in the 24 hours ending 02/13/16 1420       Recent Labs  02/11/16 0512  WBC 5.2  HGB 8.4*  HCT 25.3*  PLT 220     Blood type: O POS (05/01 1245)  Rubella: Immune (10/27 0000)     Physical Exam:   General: alert, cooperative, fatigued, no distress and mildly obese  Abdomen: soft, nontender, normal bowel sounds  Incision: clean, dry, intact and pressure dressing loosley covering [place]  Uterine Fundus: firm, 2 FB below umbilicus, nontender  Lochia: minimal  Ext: 2+ pitting edema, Homans sign is negative, no sign of DVT and TED hose in place   Assessment/Plan: 45 y.o.   POD# 3.  S/P Cesarean Delivery.  Indications: macrosomia, previous myomectomy, PEC                Principal Problem:   Postpartum care following cesarean delivery (02/10/16) Active Problems:   Acute blood loss anemia   Doing well, stable.               Regular diet as tolerated Continue Labetalol 100 mg BID to TID Start HCTZ 25 mg daily / educated to increase po H2O intake Ambulate in hallway 2-3 times today Routine post-op care Not  stable for D/C home today / Anticipate D/C home tomorrow  [name], M, MSN, CNM 02/13/2016, 2:20 PM

## 2016-02-13 NOTE — Lactation Note (Signed)
This note was copied from a baby's chart. Lactation Consultation Note New mom 37 wk. Baby who is cluster feeding and frustrated. Using #24 NS, started using formula to supplement with d/t baby to frustrated to latch. Screaming, arching, cluster feeding. Explained to parents newborn feeding habits. Baby had 10% 43 hrs old. Had 11 voids and 11 stools. Mom has anxiety d/t tired and fussy baby. Has limited mobility in her tongue movement. Upper labial lip tightness. Recess chin. bottom and top lip gets pinched in mouth when lathcing. Encouraged mom and demonstrated lip tug. VERY hard to get bottom lip to stay flanged out. Formula inserted into NS w/curve tip syring to get baby started BF d/t so aggressive. Mom has colostrum after breast massaged. Encouraged DEBP q3 hrs. Lots of teaching done. LC not sure if needs to be D/C home today until late this evening unless has improved in BF, Latching, and being comfortable w/the whole process. May benefit from additional night unless day time [facility] feels like OK to go home.  Patient Name: Girl Lupita Litherland [id number] Date: 02/13/2016 Reason for consult: Follow-up assessment;Difficult latch;Infant weight loss   Maternal Data    Feeding Feeding Type: Formula Length of feed: 15 min  LATCH Score/Interventions Latch: Repeated attempts needed to sustain latch, nipple held in mouth throughout feeding, stimulation needed to elicit sucking reflex. Intervention(s): Adjust position;Assist with latch;Breast massage;Breast compression  Audible Swallowing: A few with stimulation Intervention(s): Skin to skin;Hand expression Intervention(s): Alternate breast massage  Type of Nipple: Everted at rest and after stimulation  Comfort (Breast/Nipple): Soft / non-tender     Hold (Positioning): Full assist, staff holds infant at breast Intervention(s): Breastfeeding basics reviewed;Support Pillows;Position options;Skin to skin  LATCH Score: 6  Lactation Tools  Discussed/Used Tools: Nipple Jefferson Fuel;Pump Nipple shield size: 24 Breast pump type: Double-Electric Breast Pump Pump Review: Setup, frequency, and cleaning;Milk Storage Initiated by:: RN Date initiated:: 02/11/16   Consult Status Consult Status: Follow-up Date: 02/13/16 Follow-up type: In-patient    Marisue Canion, Elta Gu[place] 02/13/2016, 7:29 AM

## 2016-02-14 LAB — GLUCOSE, CAPILLARY: GLUCOSE-CAPILLARY: 129 mg/dL — AB (ref 65–99)

## 2016-02-14 MED ORDER — ASPIRIN 81 MG PO CHEW: 81.0000 mg | CHEWABLE_TABLET | Freq: Every day | ORAL | Status: DC

## 2016-02-14 MED ORDER — OXYCODONE-ACETAMINOPHEN 5-325 MG PO TABS: 1.0000 | ORAL_TABLET | ORAL | Status: DC | PRN

## 2016-02-14 MED ORDER — POLYSACCHARIDE IRON COMPLEX 150 MG PO CAPS: 150.0000 mg | ORAL_CAPSULE | Freq: Two times a day (BID) | ORAL | Status: DC

## 2016-02-14 MED ORDER — HYDROCHLOROTHIAZIDE 25 MG PO TABS: 25.0000 mg | ORAL_TABLET | Freq: Every day | ORAL | Status: DC

## 2016-02-14 MED ORDER — IBUPROFEN 600 MG PO TABS: 600.0000 mg | ORAL_TABLET | Freq: Four times a day (QID) | ORAL | Status: DC

## 2016-02-14 NOTE — Discharge Instructions (Signed)
Breast Pumping Tips If you are breastfeeding, there may be times when you cannot feed your baby directly. Returning to work or going on a trip are common examples. Pumping allows you to store breast milk and feed it to your baby later.  You may not get much milk when you first start to pump. Your breasts should start to make more after a few days. If you pump at the times you usually feed your baby, you may be able to keep making enough milk to feed your baby without also using formula. The more often you pump, the more milk you will produce. WHEN SHOULD I PUMP?   You can begin to pump soon after delivery. However, some experts recommend waiting about 4 weeks before giving your infant a bottle to make sure breastfeeding is going well.  If you plan to return to work, begin pumping a few weeks before. This will help you develop techniques that work best for you. It also lets you build up a supply of breast milk.   When you are with your infant, feed on demand and pump after each feeding.   When you are away from your infant for several hours, pump for about 15 minutes every 2-3 hours. Pump both breasts at the same time if you can.   If your infant has a formula feeding, make sure to pump around the same time.   If you drink any alcohol, wait 2 hours before pumping.  HOW DO I PREPARE TO PUMP? Your let-down reflexis the natural reaction to stimulation that makes your breast milk flow. It is easier to stimulate this reflex when you are relaxed. Find relaxation techniques that work for you. If you have difficulty with your let-down reflex, try these methods:   Smell one of your infant's blankets or an item of clothing.   Look at a picture or video of your infant.   Sit in a quiet, private space.   Massage the breast you plan to pump.   Place soothing warmth on the breast.   Play relaxing music.  WHAT ARE SOME GENERAL BREAST PUMPING TIPS?  Wash your hands before you pump. You  do not need to wash your nipples or breasts.  There are three ways to pump.  You can use your hand to massage and compress your breast.  You can use a handheld manual pump.  You can use an electric pump.   Make sure the suction cup (flange) on the breast pump is the right size. Place the flange directly over the nipple. If it is the wrong size or placed the wrong way, it may be painful and cause nipple damage.   If pumping is uncomfortable, apply a small amount of purified or modified lanolin to your nipple and areola.  If you are using an electric pump, adjust the speed and suction power to be more comfortable.  If pumping is painful or if you are not getting very much milk, you may need a different type of pump. A lactation consultant can help you determine what type of pump to use.   Keep a full water bottle near you at all times. Drinking lots of fluid helps you make more milk.  You can store your milk to use later. Pumped breast milk can be stored in a sealable, sterile container or plastic bag. Label all stored breast milk with the date you pumped it.  Milk can stay out at room temperature for up to 8 hours.  You can store your milk in the refrigerator for up to 8 days. °· You can store your milk in the freezer for 3 months. Thaw frozen milk using warm water. Do not put it in the microwave. °· Do not smoke. Smoking can lower your milk supply and harm your infant. If you need help quitting, ask your health care provider to recommend a program.   °WHEN SHOULD I CALL MY HEALTH CARE PROVIDER OR A LACTATION CONSULTANT? °· You are having trouble pumping. °· You are concerned that you are not making enough milk. °· You have nipple pain, soreness, or redness. °· You want to use birth control. Birth control pills may lower your milk supply. Talk to your health care provider about your options. °  °This information is not intended to replace advice given to you by your health care provider.  Make sure you discuss any questions you have with your health care provider. °  °Document Released: 03/17/2010 Document Revised: 10/02/2013 Document Reviewed: 07/20/2013 °[employer]vier Interactive Patient Education ©2016 [employer] °Postpartum Depression and Baby Blues °The postpartum period begins right after the birth of a baby. During this time, there is often a great amount of joy and excitement. It is also a time of many changes in the life of the parents. Regardless of how many times a mother gives birth, each child brings new challenges and dynamics to the family. It is not unusual to have feelings of excitement along with confusing shifts in moods, emotions, and thoughts. All mothers are at risk of developing postpartum depression or the "baby blues." These mood changes can occur right after giving birth, or they may occur many months after giving birth. The baby blues or postpartum depression can be mild or severe. Additionally, postpartum depression can go away rather quickly, or it can be a long-term condition.  °CAUSES °Raised hormone levels and the rapid drop in those levels are thought to be a main cause of postpartum depression and the baby blues. A number of hormones change during and after pregnancy. Estrogen and progesterone usually decrease right after the delivery of your baby. The levels of thyroid hormone and various cortisol steroids also rapidly drop. Other factors that play a role in these mood changes include major life events and genetics.  °RISK FACTORS °If you have any of the following risks for the baby blues or postpartum depression, know what symptoms to watch out for during the postpartum period. Risk factors that may increase the likelihood of getting the baby blues or postpartum depression include: °· Having a personal or family history of depression.   °· Having depression while being pregnant.   °· Having premenstrual mood issues or mood issues related to oral  contraceptives. °· Having a lot of life stress.   °· Having marital conflict.   °· Lacking a social support network.   °· Having a baby with special needs.   °· Having health problems, such as diabetes.   °SIGNS AND SYMPTOMS °Symptoms of baby blues include: °· Brief changes in mood, such as going from extreme happiness to sadness. °· Decreased concentration.   °· Difficulty sleeping.   °· Crying spells, tearfulness.   °· Irritability.   °· Anxiety.   °Symptoms of postpartum depression typically begin within the first month after giving birth. These symptoms include: °· Difficulty sleeping or excessive sleepiness.   °· Marked weight loss.   °· Agitation.   °· Feelings of worthlessness.   °· Lack of interest in activity or food.   °Postpartum psychosis is a very serious condition and can be dangerous. Fortunately, it is   rare. Displaying any of the following symptoms is cause for immediate medical attention. Symptoms of postpartum psychosis include:  °· Hallucinations and delusions.   °· Bizarre or disorganized behavior.   °· Confusion or disorientation.   °DIAGNOSIS  °A diagnosis is made by an evaluation of your symptoms. There are no medical or lab tests that lead to a diagnosis, but there are various questionnaires that a health care provider may use to identify those with the baby blues, postpartum depression, or psychosis. Often, a screening tool called the Edinburgh Postnatal Depression Scale is used to diagnose depression in the postpartum period.  °TREATMENT °The baby blues usually goes away on its own in 1-2 weeks. Social support is often all that is needed. You will be encouraged to get adequate sleep and rest. Occasionally, you may be given medicines to help you sleep.  °Postpartum depression requires treatment because it can last several months or longer if it is not treated. Treatment may include individual or group therapy, medicine, or both to address any social, physiological, and psychological factors  that may play a role in the depression. Regular exercise, a healthy diet, rest, and social support may also be strongly recommended.  °Postpartum psychosis is more serious and needs treatment right away. Hospitalization is often needed. °HOME CARE INSTRUCTIONS °· Get as much rest as you can. Nap when the baby sleeps.   °· Exercise regularly. Some women find yoga and walking to be beneficial.   °· Eat a balanced and nourishing diet.   °· Do little things that you enjoy. Have a cup of tea, take a bubble bath, read your favorite magazine, or listen to your favorite music. °· Avoid alcohol.   °· Ask for help with household chores, cooking, grocery shopping, or running errands as needed. Do not try to do everything.   °· Talk to people close to you about how you are feeling. Get support from your partner, family members, friends, or other new moms. °· Try to stay positive in how you think. Think about the things you are grateful for.   °· Do not spend a lot of time alone.   °· Only take over-the-counter or prescription medicine as directed by your health care provider. °· Keep all your postpartum appointments.   °· Let your health care provider know if you have any concerns.   °SEEK MEDICAL CARE IF: °You are having a reaction to or problems with your medicine. °SEEK IMMEDIATE MEDICAL CARE IF: °· You have suicidal feelings.   °· You think you may harm the baby or someone else. °MAKE SURE YOU: °· Understand these instructions. °· Will watch your condition. °· Will get help right away if you are not doing well or get worse. °  °This information is not intended to replace advice given to you by your health care provider. Make sure you discuss any questions you have with your health care provider. °  °Document Released: 07/01/2004 Document Revised: 10/02/2013 Document Reviewed: 07/09/2013 °Elsevier Interactive Patient Education ©2016 [employer] °Postpartum Care After Cesarean Delivery °After you deliver your newborn  (postpartum period), the usual stay in the hospital is 24-72 hours. If there were problems with your labor or delivery, or if you have other medical problems, you might be in the hospital longer.  °While you are in the hospital, you will receive help and instructions on how to care for yourself and your newborn during the postpartum period.  °While you are in the hospital: °· It is normal for you to have pain or discomfort from the incision in your   abdomen. Be sure to tell your nurses when you are having pain, where the pain is located, and what makes the pain worse. °· If you are breastfeeding, you may feel uncomfortable contractions of your uterus for a couple of weeks. This is normal. The contractions help your uterus get back to normal size. °· It is normal to have some bleeding after delivery. °· For the first 1-3 days after delivery, the flow is red and the amount may be similar to a period. °· It is common for the flow to start and stop. °· In the first few days, you may pass some small clots. Let your nurses know if you begin to pass large clots or your flow increases. °· Do not  flush blood clots down the toilet before having the nurse look at them. °· During the next 3-10 days after delivery, your flow should become more watery and pink or brown-tinged in color. °· Ten to fourteen days after delivery, your flow should be a small amount of yellowish-white discharge. °· The amount of your flow will decrease over the first few weeks after delivery. Your flow may stop in 6-8 weeks. Most women have had their flow stop by 12 weeks after delivery. °· You should change your sanitary pads frequently. °· Wash your hands thoroughly with soap and water for at least 20 seconds after changing pads, using the toilet, or before holding or feeding your newborn. °· Your intravenous (IV) tubing will be removed when you are drinking enough fluids. °· The urine drainage tube (urinary catheter) that was inserted before delivery  may be removed within 6-8 hours after delivery or when feeling returns to your legs. You should feel like you need to empty your bladder within the first 6-8 hours after the catheter has been removed. °· In case you become weak, lightheaded, or faint, call your nurse before you get out of bed for the first time and before you take a shower for the first time. °· Within the first few days after delivery, your breasts may begin to feel tender and full. This is called engorgement. Breast tenderness usually goes away within 48-72 hours after engorgement occurs. You may also notice milk leaking from your breasts. If you are not breastfeeding, do not stimulate your breasts. Breast stimulation can make your breasts produce more milk. °· Spending as much time as possible with your newborn is very important. During this time, you and your newborn can feel close and get to know each other. Having your newborn stay in your room (rooming in) will help to strengthen the bond with your newborn. It will give you time to get to know your newborn and become comfortable caring for your newborn. °· Your hormones change after delivery. Sometimes the hormone changes can temporarily cause you to feel sad or tearful. These feelings should not last more than a few days. If these feelings last longer than that, you should talk to your caregiver. °· If desired, talk to your caregiver about methods of family planning or contraception. °· Talk to your caregiver about immunizations. Your caregiver may want you to have the following immunizations before leaving the hospital: °· Tetanus, diphtheria, and pertussis (Tdap) or tetanus and diphtheria (Td) immunization. It is very important that you and your family (including grandparents) or others caring for your newborn are up-to-date with the Tdap or Td immunizations. The Tdap or Td immunization can help protect your newborn from getting ill. °· Rubella immunization. °· Varicella (chickenpox)    immunization. °· Influenza immunization. You should receive this annual immunization if you did not receive the immunization during your pregnancy. °  °This information is not intended to replace advice given to you by your health care provider. Make sure you discuss any questions you have with your health care provider. °  °Document Released: 06/21/2012 Document Reviewed: 06/21/2012 °Elsevier Interactive Patient Education ©2016 [employer] °Breastfeeding and Mastitis °Mastitis is inflammation of the breast tissue. It can occur in women who are breastfeeding. This can make breastfeeding painful. Mastitis will sometimes go away on its own. Your health care provider will help determine if treatment is needed. °CAUSES °Mastitis is often associated with a blocked milk (lactiferous) duct. This can happen when too much milk builds up in the breast. Causes of excess milk in the breast can include: °· Poor latch-on. If your baby is not latched onto the breast properly, she or he may not empty your breast completely while breastfeeding. °· Allowing too much time to pass between feedings. °· Wearing a bra or other clothing that is too tight. This puts extra pressure on the lactiferous ducts so milk does not flow through them as it should. °Mastitis can also be caused by a bacterial infection. Bacteria may enter the breast tissue through cuts or openings in the skin. In women who are breastfeeding, this may occur because of cracked or irritated skin. Cracks in the skin are often caused when your baby does not latch on properly to the breast. °SIGNS AND SYMPTOMS °· Swelling, redness, tenderness, and pain in an area of the breast. °· Swelling of the glands under the arm on the same side. °· Fever may or may not accompany mastitis. °If an infection is allowed to progress, a collection of pus (abscess) may develop. °DIAGNOSIS  °Your health care provider can usually diagnose mastitis based on your symptoms and a physical exam.  Tests may be done to help confirm the diagnosis. These may include: °· Removal of pus from the breast by applying pressure to the area. This pus can be examined in the lab to determine which bacteria are present. If an abscess has developed, the fluid in the abscess can be removed with a needle. This can also be used to confirm the diagnosis and determine the bacteria present. In most cases, pus will not be present. °· Blood tests to determine if your body is fighting a bacterial infection. °· Mammogram or ultrasound tests to rule out other problems or diseases. °TREATMENT  °Mastitis that occurs with breastfeeding will sometimes go away on its own. Your health care provider may choose to wait 24 hours after first seeing you to decide whether a prescription medicine is needed. If your symptoms are worse after 24 hours, your health care provider will likely prescribe an antibiotic medicine to treat the mastitis. He or she will determine which bacteria are most likely causing the infection and will then select an appropriate antibiotic medicine. This is sometimes changed based on the results of tests performed to identify the bacteria, or if there is no response to the antibiotic medicine selected. Antibiotic medicines are usually given by mouth. You may also be given medicine for pain. °HOME CARE INSTRUCTIONS °· Only take over-the-counter or prescription medicines for pain, fever, or discomfort as directed by your health care provider. °· If your health care provider prescribed an antibiotic medicine, take the medicine as directed. Make sure you finish it even if you start to feel better. °· Do not wear a   tight or underwire bra. Wear a soft, supportive bra. °· Increase your fluid intake, especially if you have a fever. °· Continue to empty the breast. Your health care provider can tell you whether this milk is safe for your infant or needs to be thrown out. You may be told to stop nursing until your health care  provider thinks it is safe for your baby. Use a breast pump if you are advised to stop nursing. °· Keep your nipples clean and dry. °· Empty the first breast completely before going to the other breast. If your baby is not emptying your breasts completely for some reason, use a breast pump to empty your breasts. °· If you go back to work, pump your breasts while at work to stay in time with your nursing schedule. °· Avoid allowing your breasts to become overly filled with milk (engorged). °SEEK MEDICAL CARE IF: °· You have pus-like discharge from the breast. °· Your symptoms do not improve with the treatment prescribed by your health care provider within 2 days. °SEEK IMMEDIATE MEDICAL CARE IF: °· Your pain and swelling are getting worse. °· You have pain that is not controlled with medicine. °· You have a red line extending from the breast toward your armpit. °· You have a fever or persistent symptoms for more than 2-3 days. °· You have a fever and your symptoms suddenly get worse. °MAKE SURE YOU:  °· Understand these instructions. °· Will watch your condition. °· Will get help right away if you are not doing well or get worse. °  °This information is not intended to replace advice given to you by your health care provider. Make sure you discuss any questions you have with your health care provider. °  °Document Released: 01/22/2005 Document Revised: 10/02/2013 Document Reviewed: 05/03/2013 °[employer]vier Interactive Patient Education ©2016 [employer] °Breastfeeding °Deciding to breastfeed is one of the best choices you can make for you and your baby. A change in hormones during pregnancy causes your breast tissue to grow and increases the number and size of your milk ducts. These hormones also allow proteins, sugars, and fats from your blood supply to make breast milk in your milk-producing glands. Hormones prevent breast milk from being released before your baby is born as well as prompt milk flow after birth. Once  breastfeeding has begun, thoughts of your baby, as well as his or her sucking or crying, can stimulate the release of milk from your milk-producing glands.  °BENEFITS OF BREASTFEEDING °For Your Baby °· Your first milk (colostrum) helps your baby's digestive system function better. °· There are antibodies in your milk that help your baby fight off infections. °· Your baby has a lower incidence of asthma, allergies, and sudden infant death syndrome. °· The nutrients in breast milk are better for your baby than infant formulas and are designed uniquely for your baby's needs. °· Breast milk improves your baby's brain development. °· Your baby is less likely to develop other conditions, such as childhood obesity, asthma, or type 2 diabetes mellitus. °For You °· Breastfeeding helps to create a very special bond between you and your baby. °· Breastfeeding is convenient. Breast milk is always available at the correct temperature and costs nothing. °· Breastfeeding helps to burn calories and helps you lose the weight gained during pregnancy. °· Breastfeeding makes your uterus contract to its prepregnancy size faster and slows bleeding (lochia) after you give birth.   °· Breastfeeding helps to lower your risk of developing type   2 diabetes mellitus, osteoporosis, and breast or ovarian cancer later in life. °SIGNS THAT YOUR BABY IS HUNGRY °Early Signs of Hunger °· Increased alertness or activity. °· Stretching. °· Movement of the head from side to side. °· Movement of the head and opening of the mouth when the corner of the mouth or cheek is stroked (rooting). °· Increased sucking sounds, smacking lips, cooing, sighing, or squeaking. °· Hand-to-mouth movements. °· Increased sucking of fingers or hands. °Late Signs of Hunger °· Fussing. °· Intermittent crying. °Extreme Signs of Hunger °Signs of extreme hunger will require calming and consoling before your baby will be able to breastfeed successfully. Do not wait for the  following signs of extreme hunger to occur before you initiate breastfeeding: °· Restlessness. °· A loud, strong cry. °· Screaming. °BREASTFEEDING BASICS °Breastfeeding Initiation °· Find a comfortable place to sit or lie down, with your neck and back well supported. °· Place a pillow or rolled up blanket under your baby to bring him or her to the level of your breast (if you are seated). Nursing pillows are specially designed to help support your arms and your baby while you breastfeed. °· Make sure that your baby's abdomen is facing your abdomen. °· Gently massage your breast. With your fingertips, massage from your chest wall toward your nipple in a circular motion. This encourages milk flow. You may need to continue this action during the feeding if your milk flows slowly. °· Support your breast with 4 fingers underneath and your thumb above your nipple. Make sure your fingers are well away from your nipple and your baby's mouth. °· Stroke your baby's lips gently with your finger or nipple. °· When your baby's mouth is open wide enough, quickly bring your baby to your breast, placing your entire nipple and as much of the colored area around your nipple (areola) as possible into your baby's mouth. °· More areola should be visible above your baby's upper lip than below the lower lip. °· Your baby's tongue should be between his or her lower gum and your breast. °· Ensure that your baby's mouth is correctly positioned around your nipple (latched). Your baby's lips should create a seal on your breast and be turned out (everted). °· It is common for your baby to suck about 2-3 minutes in order to start the flow of breast milk. °Latching °Teaching your baby how to latch on to your breast properly is very important. An improper latch can cause nipple pain and decreased milk supply for you and poor weight gain in your baby. Also, if your baby is not latched onto your nipple properly, he or she may swallow some air during  feeding. This can make your baby fussy. Burping your baby when you switch breasts during the feeding can help to get rid of the air. However, teaching your baby to latch on properly is still the best way to prevent fussiness from swallowing air while breastfeeding. °Signs that your baby has successfully latched on to your nipple: °· Silent tugging or silent sucking, without causing you pain. °· Swallowing heard between every 3-4 sucks. °· Muscle movement above and in front of his or her ears while sucking. °Signs that your baby has not successfully latched on to nipple: °· Sucking sounds or smacking sounds from your baby while breastfeeding. °· Nipple pain. °If you think your baby has not latched on correctly, slip your finger into the corner of your baby's mouth to break the suction and place it   between your baby's gums. Attempt breastfeeding initiation again. °Signs of Successful Breastfeeding °Signs from your baby: °· A gradual decrease in the number of sucks or complete cessation of sucking. °· Falling asleep. °· Relaxation of his or her body. °· Retention of a small amount of milk in his or her mouth. °· Letting go of your breast by himself or herself. °Signs from you: °· Breasts that have increased in firmness, weight, and size 1-3 hours after feeding. °· Breasts that are softer immediately after breastfeeding. °· Increased milk volume, as well as a change in milk consistency and color by the fifth day of breastfeeding. °· Nipples that are not sore, cracked, or bleeding. °Signs That Your Baby is Getting Enough Milk °· Wetting at least 3 diapers in a 24-hour period. The urine should be clear and pale yellow by age 5 days. °· At least 3 stools in a 24-hour period by age 5 days. The stool should be soft and yellow. °· At least 3 stools in a 24-hour period by age 7 days. The stool should be seedy and yellow. °· No loss of weight greater than 10% of birth weight during the first 3 days of age. °· Average weight  gain of 4-7 ounces (113-198 g) per week after age 4 days. °· Consistent daily weight gain by age 5 days, without weight loss after the age of 2 weeks. °After a feeding, your baby may spit up a small amount. This is common. °BREASTFEEDING FREQUENCY AND DURATION °Frequent feeding will help you make more milk and can prevent sore nipples and breast engorgement. Breastfeed when you feel the need to reduce the fullness of your breasts or when your baby shows signs of hunger. This is called "breastfeeding on demand." Avoid introducing a pacifier to your baby while you are working to establish breastfeeding (the first 4-6 weeks after your baby is born). After this time you may choose to use a pacifier. Research has shown that pacifier use during the first year of a baby's life decreases the risk of sudden infant death syndrome (SIDS). °Allow your baby to feed on each breast as long as he or she wants. Breastfeed until your baby is finished feeding. When your baby unlatches or falls asleep while feeding from the first breast, offer the second breast. Because newborns are often sleepy in the first few weeks of life, you may need to awaken your baby to get him or her to feed. °Breastfeeding times will vary from baby to baby. However, the following rules can serve as a guide to help you ensure that your baby is properly fed: °· Newborns (babies 4 weeks of age or younger) may breastfeed every 1-3 hours. °· Newborns should not go longer than 3 hours during the day or 5 hours during the night without breastfeeding. °· You should breastfeed your baby a minimum of 8 times in a 24-hour period until you begin to introduce solid foods to your baby at around 6 months of age. °BREAST MILK PUMPING °Pumping and storing breast milk allows you to ensure that your baby is exclusively fed your breast milk, even at times when you are unable to breastfeed. This is especially important if you are going back to work while you are still  breastfeeding or when you are not able to be present during feedings. Your lactation consultant can give you guidelines on how long it is safe to store breast milk. °A breast pump is a machine that allows you to pump milk   from your breast into a sterile bottle. The pumped breast milk can then be stored in a refrigerator or freezer. Some breast pumps are operated by hand, while others use electricity. Ask your lactation consultant which type will work best for you. Breast pumps can be purchased, but some hospitals and breastfeeding support groups lease breast pumps on a monthly basis. A lactation consultant can teach you how to hand express breast milk, if you prefer not to use a pump. °CARING FOR YOUR BREASTS WHILE YOU BREASTFEED °Nipples can become dry, cracked, and sore while breastfeeding. The following recommendations can help keep your breasts moisturized and healthy: °· Avoid using soap on your nipples. °· Wear a supportive bra. Although not required, special nursing bras and tank tops are designed to allow access to your breasts for breastfeeding without taking off your entire bra or top. Avoid wearing underwire-style bras or extremely tight bras. °· Air dry your nipples for 3-4 minutes after each feeding. °· Use only cotton bra pads to absorb leaked breast milk. Leaking of breast milk between feedings is normal. °· Use lanolin on your nipples after breastfeeding. Lanolin helps to maintain your skin's normal moisture barrier. If you use pure lanolin, you do not need to wash it off before feeding your baby again. Pure lanolin is not toxic to your baby. You may also hand express a few drops of breast milk and gently massage that milk into your nipples and allow the milk to air dry. °In the first few weeks after giving birth, some women experience extremely full breasts (engorgement). Engorgement can make your breasts feel heavy, warm, and tender to the touch. Engorgement peaks within 3-5 days after you give  birth. The following recommendations can help ease engorgement: °· Completely empty your breasts while breastfeeding or pumping. You may want to start by applying warm, moist heat (in the shower or with warm water-soaked hand towels) just before feeding or pumping. This increases circulation and helps the milk flow. If your baby does not completely empty your breasts while breastfeeding, pump any extra milk after he or she is finished. °· Wear a snug bra (nursing or regular) or tank top for 1-2 days to signal your body to slightly decrease milk production. °· Apply ice packs to your breasts, unless this is too uncomfortable for you. °· Make sure that your baby is latched on and positioned properly while breastfeeding. °If engorgement persists after 48 hours of following these recommendations, contact your health care provider or a lactation consultant. °OVERALL HEALTH CARE RECOMMENDATIONS WHILE BREASTFEEDING °· Eat healthy foods. Alternate between meals and snacks, eating 3 of each per day. Because what you eat affects your breast milk, some of the foods may make your baby more irritable than usual. Avoid eating these foods if you are sure that they are negatively affecting your baby. °· Drink milk, fruit juice, and water to satisfy your thirst (about 10 glasses a day). °· Rest often, relax, and continue to take your prenatal vitamins to prevent fatigue, stress, and anemia. °· Continue breast self-awareness checks. °· Avoid chewing and smoking tobacco. Chemicals from cigarettes that pass into breast milk and exposure to secondhand smoke may harm your baby. °· Avoid alcohol and drug use, including marijuana. °Some medicines that may be harmful to your baby can pass through breast milk. It is important to ask your health care provider before taking any medicine, including all over-the-counter and prescription medicine as well as vitamin and herbal supplements. °It is possible to become   pregnant while breastfeeding. If  birth control is desired, ask your health care provider about options that will be safe for your baby. °SEEK MEDICAL CARE IF: °· You feel like you want to stop breastfeeding or have become frustrated with breastfeeding. °· You have painful breasts or nipples. °· Your nipples are cracked or bleeding. °· Your breasts are red, tender, or warm. °· You have a swollen area on either breast. °· You have a fever or chills. °· You have nausea or vomiting. °· You have drainage other than breast milk from your nipples. °· Your breasts do not become full before feedings by the fifth day after you give birth. °· You feel sad and depressed. °· Your baby is too sleepy to eat well. °· Your baby is having trouble sleeping.   °· Your baby is wetting less than 3 diapers in a 24-hour period. °· Your baby has less than 3 stools in a 24-hour period. °· Your baby's skin or the white part of his or her eyes becomes yellow.   °· Your baby is not gaining weight by 5 days of age. °SEEK IMMEDIATE MEDICAL CARE IF: °· Your baby is overly tired (lethargic) and does not want to wake up and feed. °· Your baby develops an unexplained fever. °  °This information is not intended to replace advice given to you by your health care provider. Make sure you discuss any questions you have with your health care provider. °  °Document Released: 09/27/2005 Document Revised: 06/18/2015 Document Reviewed: 03/21/2013 °Elsevier Interactive Patient Education ©2016 [employer] ° °

## 2016-02-14 NOTE — Discharge Summary (Signed)
OB Discharge Summary     Patient Name: Leslie Velasquez DOB: 10-Dec-1970 MRN: [id number]  Date of admission: 02/10/2016 Delivering MD: Brien Few   Date of discharge: 02/14/2016  Admitting diagnosis: Breech, History of Myomectomy Intrauterine pregnancy: [redacted]w[redacted]d     Secondary diagnosis:  Principal Problem:   Postpartum care following cesarean delivery (02/10/16) Active Problems:   Acute blood loss anemia  Additional problems: none     Discharge diagnosis: Term Pregnancy Delivered and Gestational Hypertension                                                                                                Post partum procedures:none  Augmentation: none  Complications: None  Hospital course:  Sceduled C/S   45 y.o. yo [id number] at [redacted]w[redacted]d was admitted to the hospital 02/10/2016 for scheduled cesarean section with the following indication:Malpresentation and Prior Uterine Surgery.  Membrane Rupture Time/Date: 1:33 PM ,02/10/2016   Patient delivered a Viable infant.02/10/2016  Details of operation can be found in separate operative note.  Pateint had an uncomplicated postpartum course.  She is ambulating, tolerating a regular diet, passing flatus, and urinating well. Patient is discharged home in stable condition on  02/16/2016          Physical exam  Filed Vitals:   02/13/16 1412 02/13/16 1633 02/13/16 2226 02/14/16 0546  BP: 163/84 146/76 153/80 149/87  Pulse: 84   90  Temp:      TempSrc:      Resp:    18  SpO2:       General: alert, cooperative and no distress Lochia: appropriate Uterine Fundus: firm Incision: Healing well with no significant drainage, No significant erythema, skin well-approximated with sutures DVT Evaluation: No evidence of DVT seen on physical exam. Negative Homan's sign. No cords or calf tenderness. Calf/Ankle edema is present Labs: Lab Results  Component Value Date   WBC 5.2 02/11/2016   HGB 8.4* 02/11/2016   HCT 25.3* 02/11/2016   MCV 90.7 02/11/2016   PLT 220 02/11/2016   CMP Latest Ref Rng 02/10/2016  Glucose 65 - 99 mg/dL 72  BUN 6 - 20 mg/dL 10  Creatinine 0.44 - 1.00 mg/dL 0.46  Sodium 135 - 145 mmol/L 134(L)  Potassium 3.5 - 5.1 mmol/L 3.8  Chloride 101 - 111 mmol/L 107  CO2 22 - 32 mmol/L 20(L)  Calcium 8.9 - 10.3 mg/dL 8.1(L)  Total Protein 6.5 - 8.1 g/dL 6.1(L)  Total Bilirubin 0.3 - 1.2 mg/dL 0.4  Alkaline Phos 38 - 126 U/L 112  AST 15 - 41 U/L 31  ALT 14 - 54 U/L 20    Discharge instruction: per After Visit Summary and "Baby and Me Booklet".  After visit meds:    Medication List    TAKE these medications        aspirin 81 MG chewable tablet  Chew 1 tablet (81 mg total) by mouth at bedtime.     enoxaparin 40 MG/0.4ML injection  Commonly known as:  LOVENOX  Inject 0.6 mLs (60 mg total) into the skin daily.     folic acid  1 MG tablet  Commonly known as:  FOLVITE  Take 4 mg by mouth daily.     glyBURIDE 5 MG tablet  Commonly known as:  DIABETA  Take 15 mg by mouth daily with breakfast.     hydrochlorothiazide 25 MG tablet  Commonly known as:  HYDRODIURIL  Take 1 tablet (25 mg total) by mouth daily.     ibuprofen 600 MG tablet  Commonly known as:  ADVIL,MOTRIN  Take 1 tablet (600 mg total) by mouth every 6 (six) hours.     iron polysaccharides 150 MG capsule  Commonly known as:  NIFEREX  Take 1 capsule (150 mg total) by mouth 2 (two) times daily.     labetalol 100 MG tablet  Commonly known as:  NORMODYNE  Take 100 mg by mouth 3 (three) times daily.     oxyCODONE-acetaminophen 5-325 MG tablet  Commonly known as:  PERCOCET/ROXICET  Take 1-2 tablets by mouth every 4 (four) hours as needed (pain).     pantoprazole 40 MG tablet  Commonly known as:  PROTONIX  Take 40 mg by mouth daily.     PEGASYS [place]  Inject 1 each into the skin once a week. Patient receives injection every Monday at [facility].     prenatal multivitamin Tabs tablet  Take 1 tablet by mouth daily at 12 noon.        Diet: routine  diet  Activity: Advance as tolerated. Pelvic rest for 6 weeks.   Outpatient follow up: 1 week and 6 weeks Follow up Appt:No future appointments. Follow up Visit:No Follow-up on file.  Postpartum contraception: Undecided  Newborn Data: Live born female on 02/10/2016 Birth Weight: 9 lb 0.6 oz (4100 g) APGAR: 8, 8  Baby Feeding: Breast Disposition:home with mother   02/14/2016 [name], Jerilynn Mages, CNM

## 2016-02-14 NOTE — Lactation Note (Signed)
This note was copied from a baby's chart. Lactation Consultation Note  Patient Name: Leslie Velasquez [id number] Date: 02/14/2016 Reason for consult: Follow-up assessment;Infant weight loss;Difficult latch "Breast lift" 10 years ago per mom.  Baby 55 hours old. Mom reports that her breast are starting to feel fuller and heavier today. Mom reports that she had and breast lift 10 years ago. Mom has faint scars that arch just outside the areolas--roughly from 10 to 2 o'clock. Mom's breast are soft right now, but discussed with mom the possible impact to breast milk supply and discussed engorgement prevention/treatment. Mom getting 10 - 12 ml of EBM with last 2 pumping sessions--milk has never appeared yellow/colostral, but instead has been white/mature milk. Reiterated the importance of continuing to pump with each feeding and to supplement with formula to make sure baby getting enough with each feeding. Parents report that baby has been fed every 2-3 hours except for one 5-hour period last night when everyone slept. D/t [redacted]w[redacted]d GA and 11% weight loss, enc parents to feed with cues and at least every 3 hours. Parents report that baby able to suckle NS and bottle much more effectively and efficiently now. Parents have appointment with pediatrician for Monday, and intend discuss baby's oral anatomy at that visit. Parents also intend to contact a specialist for baby's frenula--lingual and labial. Discussed [place] outpatient appointment with parents, and they want to see pediatrician first, then make an appointment after procedure. Mom aware of [facility] and [place] phone line assistance after D/C for any questions regarding baby's feedings or mom's milk supply.   Plan is for mom to continue to put baby to breast with cues and at least every 3 hours. Mom will continue to use NS and supplement either at breast with SNS system or with bottle--parents intend to purchase Dr. Owens Shark with wide-based nipple. Enc mom to post-pump with each  feeding for 15 minutes or 1-2 minutes past the last letdown. Mom has personal DEBP. Baby sleeping peacefully in crib throughout most of LC assessment, and only spit up a tiny amount. Enc parents to burp well, and discussed how to know if baby getting appropriate amount at each feeding. Parents referred to Baby and Me booklet with review and given [facility] outpatient appointment guidelines.   Maternal Data    Feeding Feeding Type: Bottle Fed - Breast Milk Nipple Type: Slow - flow  LATCH Score/Interventions                      Lactation Tools Discussed/Used Tools: Nipple [place] Fuel;Pump Nipple shield size: 24 Breast pump type: Double-Electric Breast Pump   Consult Status Consult Status: PRN    Inocente Salles 02/14/2016, 9:22 AM

## 2016-02-14 NOTE — Progress Notes (Signed)
Patient ID: Leslie Velasquez, female   DOB: 07/11/1971, 45 y.o.   MRN: [id number] Subjective: S/P Primary Cesarean Delivery d/t Macrosomia / Previous Myomectomy / PEC  POD# 4 Information for the patient's newborn:  Leslie, Velasquez [id number]  female   Reports feeling very sore. Feeding: breast Patient reports tolerating PO.  Breast symptoms: none Pain controlled with ibuprofen (OTC) and narcotic analgesics including Percocet Denies HA/SOB/C/P/N/V/dizziness. Flatus present. (+) BM. "Swelling in legs is getting better, but still pretty painful." She reports vaginal bleeding as normal, without clots.  She is ambulating, urinating without difficult.     Objective:   VS:  Filed Vitals:   02/13/16 1412 02/13/16 1633 02/13/16 2226 02/14/16 0546  BP: 163/84 146/76 153/80 149/87  Pulse: 84   90  Temp:      TempSrc:      Resp:    18  SpO2:           Blood type: O POS (05/01 1245)  Rubella: Immune (10/27 0000)     Physical Exam:   General: alert, cooperative, fatigued, no distress and mildly obese  Abdomen: soft, nontender, normal bowel sounds  Incision: clean, dry, intact and pressure dressing loosley covering [place]  Uterine Fundus: firm, 3 FB below umbilicus, nontender  Lochia: minimal  Ext: 2+ pitting edema, Homans sign is negative, no sign of DVT and TED hose in place   Assessment/Plan: 45 y.o.   POD# 4.  S/P Cesarean Delivery.  Indications: macrosomia, previous myomectomy, PEC                Principal Problem:   Postpartum care following cesarean delivery (02/10/16) Active Problems:   Acute blood loss anemia   Doing well, stable.               Regular diet as tolerated Continue Labetalol 100 mg BID to TID Continue HCTZ 25 mg daily / continue increased po H2O intake Routine post-op care D/C home today  Leslie Velasquez, M, MSN, CNM 02/14/2016, 9:27 AM

## 2016-02-16 ENCOUNTER — Inpatient Hospital Stay (HOSPITAL_COMMUNITY)
Admission: AD | Admit: 2016-02-16 | Discharge: 2016-02-16 | Disposition: A | Discharge status: Home / Self Care | Payer: BLUE CROSS/BLUE SHIELD | Source: Ambulatory Visit | Attending: Obstetrics and Gynecology | Admitting: Obstetrics and Gynecology

## 2016-02-16 ENCOUNTER — Encounter (HOSPITAL_COMMUNITY): Payer: Self-pay

## 2016-02-16 DIAGNOSIS — D45 Polycythemia vera: Secondary | ICD-10-CM | POA: Diagnosis not present

## 2016-02-16 DIAGNOSIS — O165 Unspecified maternal hypertension, complicating the puerperium: Secondary | ICD-10-CM

## 2016-02-16 DIAGNOSIS — Z79899 Other long term (current) drug therapy: Secondary | ICD-10-CM | POA: Insufficient documentation

## 2016-02-16 DIAGNOSIS — Z91048 Other nonmedicinal substance allergy status: Secondary | ICD-10-CM | POA: Diagnosis not present

## 2016-02-16 LAB — COMPREHENSIVE METABOLIC PANEL
ALBUMIN: 2.9 g/dL — AB (ref 3.5–5.0)
ALK PHOS: 101 U/L (ref 38–126)
ALT: 45 U/L (ref 14–54)
ANION GAP: 9 (ref 5–15)
AST: 73 U/L — AB (ref 15–41)
BILIRUBIN TOTAL: 0.5 mg/dL (ref 0.3–1.2)
BUN: 22 mg/dL — AB (ref 6–20)
CALCIUM: 9.3 mg/dL (ref 8.9–10.3)
CO2: 24 mmol/L (ref 22–32)
Chloride: 104 mmol/L (ref 101–111)
Creatinine, Ser: 0.65 mg/dL (ref 0.44–1.00)
GFR calc Af Amer: >60 mL/min (ref >60)
GFR calc non Af Amer: >60 mL/min (ref >60)
GLUCOSE: 126 mg/dL — AB (ref 65–99)
Potassium: 4.3 mmol/L (ref 3.5–5.1)
SODIUM: 137 mmol/L (ref 135–145)
Total Protein: 6.2 g/dL — ABNORMAL LOW (ref 6.5–8.1)

## 2016-02-16 LAB — CBC
HEMATOCRIT: 29.2 % — AB (ref 36.0–46.0)
HEMOGLOBIN: 9.6 g/dL — AB (ref 12.0–15.0)
MCH: 29.8 pg (ref 26.0–34.0)
MCHC: 32.9 g/dL (ref 30.0–36.0)
MCV: 90.7 fL (ref 78.0–100.0)
Platelets: 315 10*3/uL (ref 150–400)
RBC: 3.22 MIL/uL — AB (ref 3.87–5.11)
RDW: 15.6 % — ABNORMAL HIGH (ref 11.5–15.5)
WBC: 3.9 10*3/uL — AB (ref 4.0–10.5)

## 2016-02-16 LAB — URIC ACID: Uric Acid, Serum: 8.4 mg/dL — ABNORMAL HIGH (ref 2.3–6.6)

## 2016-02-16 NOTE — MAU Note (Signed)
Pt delivered via c/s on 02/10/2016. States that shes had high BP all day. Takes 25 mg HCTZ and 100mg  labetalol TID -told to take 200mg  TID. States BP was 160/105. Denies HA. Has some blurry vision and nausea. Denies RUQ pain.

## 2016-02-16 NOTE — MAU Provider Note (Signed)
History   [id number]   Chief Complaint  Patient presents with  . Postpartum Complications  . Hypertension    HPI Leslie Velasquez is a 45 y.o. female  [phone] here for elevated blood pressures.   Here for elevated blood pressures at home.  Delivered via csetion on 02/10/16.  Discharged home on HCTZ and Labetalol.  Increased labetalol from 100 mg to 200 mg TID.  Marland Kitchen  Pt denies headache and epigastric pain.  Reports blurry vision and nausea.    Patient's last menstrual period was 05/26/2015.  OB History  Gravida Para Term Preterm AB SAB TAB Ectopic Multiple Living  3 1 1  2 2    0 1    # Outcome Date GA Lbr Len/2nd Weight Sex Delivery Anes PTL Lv  3 Term 02/10/16 [redacted]w[redacted]d  4.1 kg (9 lb 0.6 oz) F CS-LTranv Spinal  Y  2 SAB 2016          1 SAB 2015              Past Medical History  Diagnosis Date  . Asthma   . GERD (gastroesophageal reflux disease)   . 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 Cancer ([place])     polycythemia vera cancer  . Polycythemia vera ([place])   . Personal history of chemotherapy     currently every Monday takes Interferon med  . Gestational diabetes mellitus (GDM), antepartum   . 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 Polycythemia vera ([place]) 02/02/2016  . Essential thrombocythemia ([place]) 02/02/2016  . Pregnancy, supervision, high-risk 02/02/2016  . Hx of pyelonephritis   . Gestational diabetes 02/02/2016    glyburide    Family History  Problem Relation Age of Onset  . Diabetes Mother   . Hypertension Mother   . Hyperlipidemia Mother   . Cancer Father   . Diabetes Father   . Hypertension Father   . Hyperlipidemia Father   . Cancer Paternal Grandmother   . Cancer Paternal Grandfather   . Alcohol abuse Neg Hx   . Arthritis Neg Hx   . Asthma Neg Hx   . Birth defects Neg Hx   . COPD Neg Hx   . Drug abuse Neg Hx   . Depression Neg Hx   . Early death Neg Hx   . Hearing loss Neg Hx   . Heart disease Neg Hx    . Kidney disease Neg Hx   . Learning disabilities Neg Hx   . Mental illness Neg Hx   . Mental retardation Neg Hx   . Miscarriages / Stillbirths Neg Hx   . Stroke Neg Hx   . Vision loss Neg Hx   . Varicose Veins Neg Hx     Social History   Social History  . Marital Status: Married    Spouse Name: N/A  . Number of Children: N/A  . Years of Education: N/A   Social History Main Topics  . Smoking status: Never Smoker   . Smokeless tobacco: Never Used  . Alcohol Use: No  . Drug Use: No  . Sexual Activity: Yes   Other Topics Concern  . None   Social History Narrative    Allergies  Allergen Reactions  . Adhesive [Tape] Other (See Comments)    Burning/blisters    No current facility-administered medications on file prior to encounter.   Current Outpatient Prescriptions on File Prior to Encounter  Medication Sig  Dispense Refill  . aspirin 81 MG chewable tablet Chew 1 tablet (81 mg total) by mouth at bedtime. 30 tablet 0  . enoxaparin (LOVENOX) 40 MG/0.4ML injection Inject 0.6 mLs (60 mg total) into the skin daily. 30 Syringe 2  . folic acid (FOLVITE) 1 MG tablet Take 4 mg by mouth daily.    Marland Kitchen glyBURIDE (DIABETA) 5 MG tablet Take 15 mg by mouth daily with breakfast.    . hydrochlorothiazide (HYDRODIURIL) 25 MG tablet Take 1 tablet (25 mg total) by mouth daily. 5 tablet 0  . ibuprofen (ADVIL,MOTRIN) 600 MG tablet Take 1 tablet (600 mg total) by mouth every 6 (six) hours. 30 tablet 1  . iron polysaccharides (NIFEREX) 150 MG capsule Take 1 capsule (150 mg total) by mouth 2 (two) times daily. 60 capsule 3  . labetalol (NORMODYNE) 100 MG tablet Take 100 mg by mouth 3 (three) times daily.    Marland Kitchen oxyCODONE-acetaminophen (PERCOCET/ROXICET) 5-325 MG tablet Take 1-2 tablets by mouth every 4 (four) hours as needed (pain). 30 tablet 0  . pantoprazole (PROTONIX) 40 MG tablet Take 40 mg by mouth daily.    . Peginterferon alfa-2a (PEGASYS [place]) Inject 1 each into the skin once a week. Patient  receives injection every Monday at [facility].    . Prenatal Vit-Fe Fumarate-FA (PRENATAL MULTIVITAMIN) TABS tablet Take 1 tablet by mouth daily at 12 noon.        Review of Systems  Eyes: Positive for visual disturbance. Negative for photophobia.  Gastrointestinal: Positive for nausea. Negative for vomiting and abdominal pain.  Genitourinary: Positive for vaginal bleeding.  Neurological: Negative for headaches.  All other systems reviewed and are negative.    Physical Exam   Filed Vitals:   02/16/16 0119 02/16/16 0142 02/16/16 0201 02/16/16 0216  BP: 164/90 128/85 129/92 139/80  Pulse: 88 88 84 86  Temp: 98.2 F (36.8 C)     TempSrc: Oral     Resp: 18     Height: 5\' 6"  (1.676 m)     Weight: 100.699 kg (222 lb)     SpO2: 99%      Filed Vitals:   02/16/16 0201 02/16/16 0216 02/16/16 0231 02/16/16 0301  BP: 129/92 139/80 137/86 151/86  Pulse: 84 86 82 81  Temp:      TempSrc:      Resp:      Height:      Weight:      SpO2:         Physical Exam  Constitutional: She is oriented to person, place, and time. She appears well-developed and well-nourished.  HENT:  Head: Normocephalic.  Neck: Normal range of motion. Neck supple.  Cardiovascular: Normal rate, regular rhythm and normal heart sounds.   Respiratory: Effort normal and breath sounds normal. No respiratory distress.  GI: Soft. There is no tenderness. There is no guarding.  Incision site healing well; well approximated, no signs of infection; no palpable mass.  Areas along edges of steri strips reddened, consistent with contact dermatitis  Genitourinary: No bleeding in the vagina.  Musculoskeletal: Normal range of motion. She exhibits edema (2+ pedal).  Neurological: She is alert and oriented to person, place, and time. She has normal reflexes.  Skin: Skin is warm and dry. Rash ( steri strips) noted.    MAU Course  Procedures  Results for orders placed or performed during the hospital encounter of 02/16/16 (from  the past 24 hour(s))  CBC     Status: Abnormal   Collection  Time: 02/16/16  1:49 AM  Result Value Ref Range   WBC 3.9 (L) 4.0 - 10.5 K/uL   RBC 3.22 (L) 3.87 - 5.11 MIL/uL   Hemoglobin 9.6 (L) 12.0 - 15.0 g/dL   HCT 29.2 (L) 36.0 - 46.0 %   MCV 90.7 78.0 - 100.0 fL   MCH 29.8 26.0 - 34.0 pg   MCHC 32.9 30.0 - 36.0 g/dL   RDW 15.6 (H) 11.5 - 15.5 %   Platelets 315 150 - 400 K/uL  Comprehensive metabolic panel     Status: Abnormal   Collection Time: 02/16/16  1:49 AM  Result Value Ref Range   Sodium 137 135 - 145 mmol/L   Potassium 4.3 3.5 - 5.1 mmol/L   Chloride 104 101 - 111 mmol/L   CO2 24 22 - 32 mmol/L   Glucose, Bld 126 (H) 65 - 99 mg/dL   BUN 22 (H) 6 - 20 mg/dL   Creatinine, Ser 0.65 0.44 - 1.00 mg/dL   Calcium 9.3 8.9 - 10.3 mg/dL   Total Protein 6.2 (L) 6.5 - 8.1 g/dL   Albumin 2.9 (L) 3.5 - 5.0 g/dL   AST 73 (H) 15 - 41 U/L   ALT 45 14 - 54 U/L   Alkaline Phosphatase 101 38 - 126 U/L   Total Bilirubin 0.5 0.3 - 1.2 mg/dL   GFR calc non Af Amer >60 >60 mL/min   GFR calc Af Amer >60 >60 mL/min   Anion gap 9 5 - 15  Uric acid     Status: Abnormal   Collection Time: 02/16/16  1:49 AM  Result Value Ref Range   Uric Acid, Serum 8.4 (H) 2.3 - 6.6 mg/dL   0315 Dr. Ronita Hipps called and reviewed HPI/vitals/Exam/labs > return to office in 48 hours to repeat labs (due to history of blood disorder and elevate enzymes desires to see if it continues trending upward), remove steri-strips, and if develops headache return to MAU.  Take increased dose of labetalol 200 mg TID.  0320 Steri strips removed without difficulty > incision healing well  Assessment and Plan  Postpartum Hypertension  Plan: Discharge home Labetalol 200 mg TID Follow-up in office in 48 hours for repeat labs Preeclampsia precautions reviewed (inform regarding headache or worsening of symptoms)  Gwen Pounds, CNM 02/16/2016 3:25 AM

## 2016-03-02 ENCOUNTER — Encounter (HOSPITAL_COMMUNITY): Payer: Self-pay | Admitting: Emergency Medicine

## 2016-03-02 ENCOUNTER — Emergency Department (HOSPITAL_COMMUNITY): Payer: BLUE CROSS/BLUE SHIELD

## 2016-03-02 ENCOUNTER — Observation Stay (HOSPITAL_COMMUNITY)
Admission: EM | Admit: 2016-03-02 | Discharge: 2016-03-03 | Disposition: A | Discharge status: Home / Self Care | Payer: BLUE CROSS/BLUE SHIELD | Attending: Family Medicine | Admitting: Family Medicine

## 2016-03-02 ENCOUNTER — Other Ambulatory Visit: Payer: Self-pay

## 2016-03-02 DIAGNOSIS — D473 Essential (hemorrhagic) thrombocythemia: Secondary | ICD-10-CM | POA: Diagnosis present

## 2016-03-02 DIAGNOSIS — Z7982 Long term (current) use of aspirin: Secondary | ICD-10-CM | POA: Insufficient documentation

## 2016-03-02 DIAGNOSIS — D649 Anemia, unspecified: Secondary | ICD-10-CM | POA: Insufficient documentation

## 2016-03-02 DIAGNOSIS — I5021 Acute systolic (congestive) heart failure: Secondary | ICD-10-CM | POA: Insufficient documentation

## 2016-03-02 DIAGNOSIS — K219 Gastro-esophageal reflux disease without esophagitis: Secondary | ICD-10-CM | POA: Diagnosis not present

## 2016-03-02 DIAGNOSIS — O1490 Unspecified pre-eclampsia, unspecified trimester: Secondary | ICD-10-CM | POA: Diagnosis present

## 2016-03-02 DIAGNOSIS — I509 Heart failure, unspecified: Secondary | ICD-10-CM

## 2016-03-02 DIAGNOSIS — I110 Hypertensive heart disease with heart failure: Secondary | ICD-10-CM | POA: Diagnosis not present

## 2016-03-02 DIAGNOSIS — I5023 Acute on chronic systolic (congestive) heart failure: Secondary | ICD-10-CM

## 2016-03-02 DIAGNOSIS — E876 Hypokalemia: Secondary | ICD-10-CM | POA: Insufficient documentation

## 2016-03-02 DIAGNOSIS — D45 Polycythemia vera: Secondary | ICD-10-CM

## 2016-03-02 DIAGNOSIS — R509 Fever, unspecified: Secondary | ICD-10-CM

## 2016-03-02 DIAGNOSIS — F419 Anxiety disorder, unspecified: Secondary | ICD-10-CM | POA: Insufficient documentation

## 2016-03-02 DIAGNOSIS — Z9221 Personal history of antineoplastic chemotherapy: Secondary | ICD-10-CM | POA: Diagnosis not present

## 2016-03-02 DIAGNOSIS — I158 Other secondary hypertension: Secondary | ICD-10-CM | POA: Diagnosis not present

## 2016-03-02 DIAGNOSIS — I10 Essential (primary) hypertension: Secondary | ICD-10-CM | POA: Diagnosis present

## 2016-03-02 HISTORY — DX: Essential (primary) hypertension: I10

## 2016-03-02 HISTORY — DX: Heart failure, unspecified: I50.9

## 2016-03-02 LAB — CBC WITH DIFFERENTIAL/PLATELET
Basophils Absolute: 0 10*3/uL (ref 0.0–0.1)
Basophils Relative: 0 %
EOS ABS: 0.2 10*3/uL (ref 0.0–0.7)
EOS PCT: 3 %
HCT: 34.3 % — ABNORMAL LOW (ref 36.0–46.0)
Hemoglobin: 11 g/dL — ABNORMAL LOW (ref 12.0–15.0)
LYMPHS ABS: 0.9 10*3/uL (ref 0.7–4.0)
LYMPHS PCT: 16 %
MCH: 28.9 pg (ref 26.0–34.0)
MCHC: 32.1 g/dL (ref 30.0–36.0)
MCV: 90 fL (ref 78.0–100.0)
MONO ABS: 0.3 10*3/uL (ref 0.1–1.0)
MONOS PCT: 6 %
Neutro Abs: 4.3 10*3/uL (ref 1.7–7.7)
Neutrophils Relative %: 75 %
PLATELETS: 414 10*3/uL — AB (ref 150–400)
RBC: 3.81 MIL/uL — AB (ref 3.87–5.11)
RDW: 14.8 % (ref 11.5–15.5)
WBC: 5.8 10*3/uL (ref 4.0–10.5)

## 2016-03-02 LAB — COMPREHENSIVE METABOLIC PANEL
ALT: 28 U/L (ref 14–54)
ANION GAP: 9 (ref 5–15)
AST: 26 U/L (ref 15–41)
Albumin: 3.5 g/dL (ref 3.5–5.0)
Alkaline Phosphatase: 101 U/L (ref 38–126)
BUN: 11 mg/dL (ref 6–20)
CALCIUM: 8.9 mg/dL (ref 8.9–10.3)
CHLORIDE: 107 mmol/L (ref 101–111)
CO2: 23 mmol/L (ref 22–32)
CREATININE: 0.72 mg/dL (ref 0.44–1.00)
Glucose, Bld: 93 mg/dL (ref 65–99)
Potassium: 4 mmol/L (ref 3.5–5.1)
SODIUM: 139 mmol/L (ref 135–145)
TOTAL PROTEIN: 6.3 g/dL — AB (ref 6.5–8.1)
Total Bilirubin: 0.3 mg/dL (ref 0.3–1.2)

## 2016-03-02 LAB — URINALYSIS, ROUTINE W REFLEX MICROSCOPIC
BILIRUBIN URINE: NEGATIVE
GLUCOSE, UA: NEGATIVE mg/dL
KETONES UR: NEGATIVE mg/dL
NITRITE: NEGATIVE
PH: 6 (ref 5.0–8.0)
Protein, ur: 30 mg/dL — AB
Specific Gravity, Urine: 1.021 (ref 1.005–1.030)

## 2016-03-02 LAB — I-STAT TROPONIN, ED: TROPONIN I, POC: 0.01 ng/mL (ref 0.00–0.08)

## 2016-03-02 LAB — I-STAT ARTERIAL BLOOD GAS, ED
ACID-BASE DEFICIT: 1 mmol/L (ref 0.0–2.0)
Bicarbonate: 22 mEq/L (ref 20.0–24.0)
O2 SAT: 97 %
PH ART: 7.437 (ref 7.350–7.450)
TCO2: 23 mmol/L (ref 0–100)
pCO2 arterial: 32.7 mmHg — ABNORMAL LOW (ref 35.0–45.0)
pO2, Arterial: 89 mmHg (ref 80.0–100.0)

## 2016-03-02 LAB — URINE MICROSCOPIC-ADD ON

## 2016-03-02 LAB — I-STAT CHEM 8, ED
BUN: 11 mg/dL (ref 6–20)
Calcium, Ion: 1.14 mmol/L (ref 1.12–1.23)
Chloride: 104 mmol/L (ref 101–111)
Creatinine, Ser: 0.7 mg/dL (ref 0.44–1.00)
Glucose, Bld: 141 mg/dL — ABNORMAL HIGH (ref 65–99)
HEMATOCRIT: 35 % — AB (ref 36.0–46.0)
HEMOGLOBIN: 11.9 g/dL — AB (ref 12.0–15.0)
POTASSIUM: 3.4 mmol/L — AB (ref 3.5–5.1)
SODIUM: 143 mmol/L (ref 135–145)
TCO2: 21 mmol/L (ref 0–100)

## 2016-03-02 LAB — PROTEIN / CREATININE RATIO, URINE
Creatinine, Urine: 152.11 mg/dL
Protein Creatinine Ratio: 0.28 mg/mg{Cre} — ABNORMAL HIGH (ref 0.00–0.15)
Total Protein, Urine: 43 mg/dL

## 2016-03-02 LAB — TROPONIN I

## 2016-03-02 LAB — BRAIN NATRIURETIC PEPTIDE: B Natriuretic Peptide: 628.4 pg/mL — ABNORMAL HIGH (ref 0.0–100.0)

## 2016-03-02 MED ORDER — FOLIC ACID 1 MG PO TABS
4.0000 mg | ORAL_TABLET | Freq: Every day | ORAL | Status: DC
  Administered 2016-03-03: 4 mg via ORAL
  Filled 2016-03-02: qty 4

## 2016-03-02 MED ORDER — ENOXAPARIN SODIUM 60 MG/0.6ML ~~LOC~~ SOLN
60.0000 mg | SUBCUTANEOUS | Status: DC
  Administered 2016-03-03: 60 mg via SUBCUTANEOUS
  Filled 2016-03-02: qty 0.6

## 2016-03-02 MED ORDER — PANTOPRAZOLE SODIUM 40 MG PO TBEC
40.0000 mg | DELAYED_RELEASE_TABLET | Freq: Every day | ORAL | Status: DC
  Administered 2016-03-03: 40 mg via ORAL
  Filled 2016-03-02: qty 1

## 2016-03-02 MED ORDER — IOPAMIDOL (ISOVUE-370) INJECTION 76%
INTRAVENOUS | Status: AC
  Administered 2016-03-02: 100 mL
  Filled 2016-03-02: qty 100

## 2016-03-02 MED ORDER — POTASSIUM CHLORIDE CRYS ER 20 MEQ PO TBCR
20.0000 meq | EXTENDED_RELEASE_TABLET | Freq: Two times a day (BID) | ORAL | Status: DC
  Administered 2016-03-03 (×2): 20 meq via ORAL
  Filled 2016-03-02 (×2): qty 1

## 2016-03-02 MED ORDER — LABETALOL HCL 5 MG/ML IV SOLN: 20.0000 mg | INTRAVENOUS | Status: DC | PRN

## 2016-03-02 MED ORDER — FUROSEMIDE 10 MG/ML IJ SOLN
20.0000 mg | Freq: Two times a day (BID) | INTRAMUSCULAR | Status: DC
  Administered 2016-03-03: 20 mg via INTRAVENOUS
  Filled 2016-03-02: qty 2

## 2016-03-02 MED ORDER — ENOXAPARIN SODIUM 60 MG/0.6ML ~~LOC~~ SOLN: 60.0000 mg | Freq: Every day | SUBCUTANEOUS | Status: DC

## 2016-03-02 MED ORDER — SODIUM CHLORIDE 0.9% FLUSH
3.0000 mL | Freq: Two times a day (BID) | INTRAVENOUS | Status: DC
  Administered 2016-03-02 – 2016-03-03 (×2): 3 mL via INTRAVENOUS

## 2016-03-02 MED ORDER — ASPIRIN 81 MG PO CHEW
81.0000 mg | CHEWABLE_TABLET | Freq: Every day | ORAL | Status: DC
  Administered 2016-03-03: 81 mg via ORAL
  Filled 2016-03-02: qty 1

## 2016-03-02 MED ORDER — SODIUM CHLORIDE 0.9% FLUSH: 3.0000 mL | INTRAVENOUS | Status: DC | PRN

## 2016-03-02 MED ORDER — LISINOPRIL 5 MG PO TABS
5.0000 mg | ORAL_TABLET | Freq: Every day | ORAL | Status: DC
  Administered 2016-03-03: 5 mg via ORAL
  Filled 2016-03-02: qty 1

## 2016-03-02 MED ORDER — OXYCODONE-ACETAMINOPHEN 5-325 MG PO TABS: 1.0000 | ORAL_TABLET | ORAL | Status: DC | PRN

## 2016-03-02 MED ORDER — IBUPROFEN 600 MG PO TABS
600.0000 mg | ORAL_TABLET | Freq: Four times a day (QID) | ORAL | Status: DC | PRN
  Administered 2016-03-03: 600 mg via ORAL
  Filled 2016-03-02: qty 1

## 2016-03-02 MED ORDER — FUROSEMIDE 10 MG/ML IJ SOLN
20.0000 mg | Freq: Once | INTRAMUSCULAR | Status: AC
  Administered 2016-03-02: 20 mg via INTRAVENOUS
  Filled 2016-03-02: qty 2

## 2016-03-02 MED ORDER — PRENATAL PLUS 27-1 MG PO TABS
1.0000 | ORAL_TABLET | Freq: Every day | ORAL | Status: DC
Start: 2016-03-03 — End: 2016-03-03
  Administered 2016-03-03: 1 via ORAL
  Filled 2016-03-02 (×2): qty 1

## 2016-03-02 MED ORDER — ONDANSETRON HCL 4 MG/2ML IJ SOLN: 4.0000 mg | Freq: Four times a day (QID) | INTRAMUSCULAR | Status: DC | PRN

## 2016-03-02 MED ORDER — LABETALOL HCL 200 MG PO TABS
200.0000 mg | ORAL_TABLET | Freq: Three times a day (TID) | ORAL | Status: DC
  Administered 2016-03-03 (×3): 200 mg via ORAL
  Filled 2016-03-02 (×3): qty 1

## 2016-03-02 MED ORDER — LABETALOL HCL 5 MG/ML IV SOLN
20.0000 mg | Freq: Once | INTRAVENOUS | Status: AC
  Administered 2016-03-02: 20 mg via INTRAVENOUS
  Filled 2016-03-02: qty 4

## 2016-03-02 MED ORDER — SODIUM CHLORIDE 0.9 % IV SOLN: 250.0000 mL | INTRAVENOUS | Status: DC | PRN

## 2016-03-02 NOTE — ED Notes (Addendum)
Pt st's she has been hypertensive since she gave birth 3 weeks ago.  Pt st's she was put on HTN meds but is not helping.  Pt st's today she feels very fatigued, has blurred vision and has pressure in her head. Also c/o feeling short of breath.  Pt is currently taking Lovenox injections.  Pt also c/o pressure in chest.

## 2016-03-02 NOTE — ED Notes (Signed)
Pt ambulated in hallway. Pts 02 remained 97%, HR 120s. Pt complained of increased shortness of breath with ambulation.

## 2016-03-02 NOTE — ED Notes (Signed)
Pt. Tachypnea, tachycardic, and SOB walking to room. Pt. sts this is not baseline for her.

## 2016-03-02 NOTE — ED Notes (Signed)
Attempted to call report x 1  

## 2016-03-02 NOTE — H&P (Signed)
History and Physical    Leslie Velasquez [id number] DOB: 15-Apr-1971 DOA: 03/02/2016  PCP: No PCP Per Patient   Patient coming from: Home  Chief Complaint: SOB with minimal exertion   HPI: Leslie Velasquez is a 45 y.o. female with medical history significant for polycythemia vera, hypertension, and mild preeclampsia with delivery by cesarean section on 02/10/2016 now presenting to the ED with worsening dyspnea upon minimal exertion. The patient's pregnancy was complicated by gestational diabetes which was diet controlled and preeclampsia, managed with labetalol. She gave birth to a healthy baby girl, their first, 3 weeks ago today and has since experienced progressive dyspnea. Dyspnea has worsened, particularly over the past day, and has been associated with orthopnea and PND. Patient reports that her lower extremities were edematous towards the end of the pregnancy but has resolved. Patient denies chest pain per se, but endorses occasional transient pressure sensation. Patient is managed for polycythemia vera with daily Lovenox injections and weekly pegylated interferon alfa. She does not smoke, is not on birth control, denies history of VTE, and denies swelling or tenderness of the unilateral lower extremity.  ED Course: Upon arrival to the ED, patient is found to be afebrile, saturating adequately on room air, tachypneic in the 30s, tachycardic in the low 100s, and mildly hypertensive. EKG features a sinus tachycardia with rate 111 and right axis deviation. CT angiogram of the chest was obtained, and while negative for pulmonary embolism, is notable for cardiomegaly and small bilateral pleural effusions. Minimal interstitial edema and splenomegaly are also noted on the CTA. CMP is largely unremarkable and CBC features a hemoglobin of 11.0 and platelet count of 414,000. Troponin is undetectable and BNP is 628. Urinalysis features large blood and 30 protein. Dr. Ronita Hipps of OB/GYN was consulted by the EDP,  reviewed the protein to creatinine ratio and blood pressures, feels this is consistent with mild preeclampsia, and advises increasing labetalol to 200 mg TID and diuresing. Patient was given a 20 mg IV push of labetalol and a 20 mg IV push of Lasix in the emergency department and began to diurese. Patient will be admitted to the telemetry unit for ongoing evaluation and management of apparent acute CHF, concerning for development of postpartum cardiomyopathy.   Review of Systems:  All other systems reviewed and apart from HPI, are negative.  Past Medical History  Diagnosis Date  . Asthma   . GERD (gastroesophageal reflux disease)   . 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 Cancer ([place])     polycythemia vera cancer  . Polycythemia vera ([place])   . Personal history of chemotherapy     currently every Monday takes Interferon med  . Gestational diabetes mellitus (GDM), antepartum   . 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 Polycythemia vera ([place]) 02/02/2016  . Essential thrombocythemia ([place]) 02/02/2016  . Pregnancy, supervision, high-risk 02/02/2016  . Hx of pyelonephritis   . Gestational diabetes 02/02/2016    glyburide  . Hypertension     Past Surgical History  Procedure Laterality Date  . Myomectomy    . Tonsillectomy    . Wisdom tooth extraction    . Colonoscopy    . Dilation and evacuation N/A 01/07/2015    Procedure: DILATATION AND EVACUATION with Tissue Sent For Chromosome Analysis;  Surgeon: Brien Few, MD;  Location: [facility];  Service: Gynecology;  Laterality: N/A;  . Cesarean section N/A 02/10/2016    Procedure: Primary  CESAREAN SECTION;  Surgeon: Brien Few, MD;  Location: [facility];  Service: Obstetrics;  Laterality: N/A;  EDD: 03/02/16     reports that she has never smoked. She has never used smokeless tobacco. She reports that she does not drink alcohol or use illicit drugs.  Allergies  Allergen Reactions    . Adhesive [Tape] Other (See Comments)    Burning/blisters    Family History  Problem Relation Age of Onset  . Diabetes Mother   . Hypertension Mother   . Hyperlipidemia Mother   . Cancer Father   . Diabetes Father   . Hypertension Father   . Hyperlipidemia Father   . Cancer Paternal Grandmother   . Cancer Paternal Grandfather   . Alcohol abuse Neg Hx   . Arthritis Neg Hx   . Asthma Neg Hx   . Birth defects Neg Hx   . COPD Neg Hx   . Drug abuse Neg Hx   . Depression Neg Hx   . Early death Neg Hx   . Hearing loss Neg Hx   . Heart disease Neg Hx   . Kidney disease Neg Hx   . Learning disabilities Neg Hx   . Mental illness Neg Hx   . Mental retardation Neg Hx   . Miscarriages / Stillbirths Neg Hx   . Stroke Neg Hx   . Vision loss Neg Hx   . Varicose Veins Neg Hx      Prior to Admission medications   Medication Sig Start Date End Date Taking? Authorizing Provider  aspirin 81 MG chewable tablet Chew 1 tablet (81 mg total) by mouth at bedtime. 02/14/16  Yes [name], CNM  enoxaparin (LOVENOX) 40 MG/0.4ML injection Inject 0.6 mLs (60 mg total) into the skin daily. 02/02/16  Yes Annia Belt, MD  folic acid (FOLVITE) 1 MG tablet Take 4 mg by mouth daily.   Yes Historical Provider, MD  hydrochlorothiazide (HYDRODIURIL) 25 MG tablet Take 1 tablet (25 mg total) by mouth daily. 02/14/16  Yes [name], CNM  ibuprofen (ADVIL,MOTRIN) 600 MG tablet Take 1 tablet (600 mg total) by mouth every 6 (six) hours. 02/14/16  Yes [name], CNM  labetalol (NORMODYNE) 100 MG tablet Take 200 mg by mouth 3 (three) times daily.    Yes Historical Provider, MD  oxyCODONE-acetaminophen (PERCOCET/ROXICET) 5-325 MG tablet Take 1-2 tablets by mouth every 4 (four) hours as needed (pain). 02/14/16  Yes [name], CNM  pantoprazole (PROTONIX) 40 MG tablet Take 40 mg by mouth daily.   Yes Historical Provider, MD  Peginterferon alfa-2a (PEGASYS [place]) Inject 1 each into the skin once a week.  Patient receives injection every Monday at [facility].   Yes Historical Provider, MD  Prenatal Vit-Fe Fumarate-FA (PRENATAL MULTIVITAMIN) TABS tablet Take 1 tablet by mouth daily at 12 noon.    Yes Historical Provider, MD    Physical Exam: Filed Vitals:   03/02/16 2000 03/02/16 2015 03/02/16 2030 03/02/16 2045  BP: 152/94 150/90 155/99 156/87  Pulse: 106 100 103 103  Temp:      TempSrc:      Resp: 24 25 35 34  Height:      Weight:      SpO2: 97% 96% 98% 97%      Constitutional: NAD, calm, comfortable Eyes: PERTLA, lids and conjunctivae normal ENMT: Mucous membranes are moist. Posterior pharynx clear of any exudate or lesions.   Neck: normal, supple, no masses, no thyromegaly Respiratory: Bibasilar crackles, no wheezing. Normal respiratory  effort. No accessory muscle use.  Cardiovascular: S1 & S2 heard, regular rate and rhythm, no significant murmurs / rubs / gallops. No extremity edema. 2+ pedal pulses.   Abdomen: No distension, no tenderness, no masses palpated. Bowel sounds normal. Suprapubic surgical incision c/d/i Musculoskeletal: no clubbing / cyanosis. No joint deformity upper and lower extremities. Normal muscle tone.  Skin: no significant rashes, lesions, ulcers. Warm, dry, well-perfused. Neurologic: CN 2-12 grossly intact. Sensation intact, DTR normal. Strength 5/5 in all 4 limbs.  Psychiatric: Normal judgment and insight. Alert and oriented x 3. Normal mood and affect.     Labs on Admission: I have personally reviewed following labs and imaging studies  CBC:  Recent Labs Lab 03/02/16 1552 03/02/16 1829  WBC 5.8  --   NEUTROABS 4.3  --   HGB 11.0* 11.9*  HCT 34.3* 35.0*  MCV 90.0  --   PLT 414*  --    Basic Metabolic Panel:  Recent Labs Lab 03/02/16 1552 03/02/16 1829  NA 139 143  K 4.0 3.4*  CL 107 104  CO2 23  --   GLUCOSE 93 141*  BUN 11 11  CREATININE 0.72 0.70  CALCIUM 8.9  --    GFR: Estimated Creatinine Clearance: 106.6 mL/min (by C-G  formula based on Cr of 0.7). Liver Function Tests:  Recent Labs Lab 03/02/16 1552  AST 26  ALT 28  ALKPHOS 101  BILITOT 0.3  PROT 6.3*  ALBUMIN 3.5   No results for input(s): LIPASE, AMYLASE in the last 168 hours. No results for input(s): AMMONIA in the last 168 hours. Coagulation Profile: No results for input(s): INR, PROTIME in the last 168 hours. Cardiac Enzymes:  Recent Labs Lab 03/02/16 1821  TROPONINI <0.03   BNP (last 3 results) No results for input(s): PROBNP in the last 8760 hours. HbA1C: No results for input(s): HGBA1C in the last 72 hours. CBG: No results for input(s): GLUCAP in the last 168 hours. Lipid Profile: No results for input(s): CHOL, HDL, LDLCALC, TRIG, CHOLHDL, LDLDIRECT in the last 72 hours. Thyroid Function Tests: No results for input(s): TSH, T4TOTAL, FREET4, T3FREE, THYROIDAB in the last 72 hours. Anemia Panel: No results for input(s): VITAMINB12, FOLATE, FERRITIN, TIBC, IRON, RETICCTPCT in the last 72 hours. Urine analysis:    Component Value Date/Time   COLORURINE YELLOW 03/02/2016 1704   APPEARANCEUR CLOUDY* 03/02/2016 1704   LABSPEC 1.021 03/02/2016 1704   PHURINE 6.0 03/02/2016 1704   GLUCOSEU NEGATIVE 03/02/2016 1704   HGBUR LARGE* 03/02/2016 1704   BILIRUBINUR NEGATIVE 03/02/2016 1704   KETONESUR NEGATIVE 03/02/2016 1704   PROTEINUR 30* 03/02/2016 1704   NITRITE NEGATIVE 03/02/2016 1704   LEUKOCYTESUR MODERATE* 03/02/2016 1704   Sepsis Labs: @LABRCNTIP (procalcitonin:4,lacticidven:4) )No results found for this or any previous visit (from the past 240 hour(s)).   Radiological Exams on Admission: Dg Chest 2 View  03/02/2016  CLINICAL DATA:  Shortness of breath and chest heaviness EXAM: CHEST  2 VIEW COMPARISON:  None. FINDINGS: Cardiac shadow is within normal limits. Minimal left basilar atelectasis is noted. No focal infiltrate or sizable effusion is seen. No acute bony abnormality is noted. IMPRESSION: Minimal left basilar  atelectasis. Electronically Signed   By: Inez Catalina M.D.   On: 03/02/2016 17:25   Ct Angio Chest Pe W/cm &/or Wo Cm  03/02/2016  CLINICAL DATA:  Shortness of breath and chest pain 1 week. Symptoms worse over the past 3 days. Hypertension. EXAM: CT ANGIOGRAPHY CHEST WITH CONTRAST TECHNIQUE: Multidetector CT imaging of  the chest was performed using the standard protocol during bolus administration of intravenous contrast. Multiplanar CT image reconstructions and MIPs were obtained to evaluate the vascular anatomy. CONTRAST:  80 mL Isovue 370 IV COMPARISON:  CT abdomen/ pelvis 08/08/2012.  Chest x-ray 03/02/2016 FINDINGS: Lungs adequately inflated demonstrate small bilateral pleural effusions. There is mild hazy attenuation of the central perihilar vessels suggesting mild edema. Airways are within normal. There is mild cardiomegaly. Pulmonary arterial system is within normal without emboli. No evidence of hilar or mediastinal adenopathy. Thoracic aorta is unremarkable. Remaining mediastinal structures are within normal. Bilateral breast implants are present. Images through the upper abdomen demonstrate splenomegaly. Remainder of the exam is unchanged. A Few calcifications over the right lobe of the liver unchanged. Review of the MIP images confirms the above findings. IMPRESSION: No evidence of pulmonary embolism. Cardiomegaly with small bilateral pleural effusions and suggestion of minimal interstitial edema. Splenomegaly. Electronically Signed   By: Marin Olp M.D.   On: 03/02/2016 19:28    EKG: Independently reviewed. Sinus tachycardia (rate 111), RAD   Assessment/Plan  1. Acute CHF  - Presents with progressive dyspnea and severe orthopnea - CT chest with cardiomegaly, small bilateral pleural effusions, minimal interstitial edema - BNP elevated to 628; no appreciable gallop or significant JVD; no significant peripheral edema - Patient gave birth on 02/10/2016, raising concern for postpartum  cardiomyopathy - She is diuresing well after 20 mg IV Lasix in the ED - Follow strict I/O, daily weights, fluid restrict diet, saline lock IV - TTE ordered   2. Preeclampsia  - EDP discussed the case with the patient's obstetrician who reviewed the labs and vitals and reports this to be a mild case - Increase labetalol to 200 mg 3 times a day per obstetrician recommendation  3. Hypokalemia   - Potassium 3.4 - Supplement potassium, 20 mEq twice a day during diuresis - Check magnesium level and replete as needed - Repeat chemistry panel in the morning  4. Polycythemia vera  - Managed with weekly pegylated interferon alfa and daily Lovenox  - Continue current management   5. Normocytic anemia - Hgb 11.0 on admission, increased from recent priors  - Likely attributable to intraoperative losses from Cesarean section  - No sign of active blood loss    6. Fever - Spiked fever while on the floor after admission  - Denies urinary sxs; no infiltrate on chest CT; surgical site clean and dry; abd exam benign  - Blood cultures obtained  - Will watch off abx for now as pt is not septic, appears quite well, and there is no clear source to treat   DVT prophylaxis: sq Lovenox  Code Status: Full  Family Communication: Husband at bedside Disposition Plan: Observe on telemetry   Consults called: OB/GYN consulted by EDP  Admission status: Observation     Vianne Bulls, MD [facility] Pager [phone]  If 7PM-7AM, please contact night-coverage [email or web address] Password [email or web address]  03/02/2016, 11:01 PM

## 2016-03-02 NOTE — ED Notes (Signed)
Pt. Transported to CT at this time.  

## 2016-03-02 NOTE — ED Provider Notes (Signed)
CSN: [id number]     Arrival date & time 03/02/16  1505 History   First MD Initiated Contact with Patient 03/02/16 1634     Chief Complaint  Patient presents with  . Hypertension     (Consider location/radiation/quality/duration/timing/severity/associated sxs/prior Treatment) HPI Comments: Patient presents from [facility] office with elevated blood pressure for the past 3 weeks. She gave birth on May 2 by C-section this is her first pregnancy. She states for the past 3 days with the pregnancy her obstetrician put her on labetalol for elevated blood pressures in the 160 range. She had no blood pressure pills before this. She has a history of polycythemia vera and has been getting Lovenox daily since giving birth. She also gets interferon weekly basis. She states her blood pressure has been as high as 0000000 systolic at home. She is having increased fatigue, blurry vision, shortness of breath that is worse with exertion. Today she's also had chest pressure that is constant all day. Denies any headache currently. No focal weakness, numbness or tingling. She saw the midwife for Dr. Ronita Hipps and was told to come to the ED.  The history is provided by the patient and a relative.    Past Medical History  Diagnosis Date  . Asthma   . GERD (gastroesophageal reflux disease)   . 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 Cancer ([place])     polycythemia vera cancer  . Polycythemia vera ([place])   . Personal history of chemotherapy     currently every Monday takes Interferon med  . Gestational diabetes mellitus (GDM), antepartum   . 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 Polycythemia vera ([place]) 02/02/2016  . 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 Pregnancy, supervision, high-risk 02/02/2016  . Hx of pyelonephritis   . Gestational diabetes 02/02/2016    glyburide   Past Surgical History  Procedure Laterality Date  . Myomectomy    . Tonsillectomy    . Wisdom  tooth extraction    . Colonoscopy    . Dilation and evacuation N/A 01/07/2015    Procedure: DILATATION AND EVACUATION with Tissue Sent For Chromosome Analysis;  Surgeon: Brien Few, MD;  Location: [facility];  Service: Gynecology;  Laterality: N/A;  . Cesarean section N/A 02/10/2016    Procedure: Primary CESAREAN SECTION;  Surgeon: Brien Few, MD;  Location: [place];  Service: Obstetrics;  Laterality: N/A;  EDD: 03/02/16   Family History  Problem Relation Age of Onset  . Diabetes Mother   . Hypertension Mother   . Hyperlipidemia Mother   . Cancer Father   . Diabetes Father   . Hypertension Father   . Hyperlipidemia Father   . Cancer Paternal Grandmother   . Cancer Paternal Grandfather   . Alcohol abuse Neg Hx   . Arthritis Neg Hx   . Asthma Neg Hx   . Birth defects Neg Hx   . COPD Neg Hx   . Drug abuse Neg Hx   . Depression Neg Hx   . Early death Neg Hx   . Hearing loss Neg Hx   . Heart disease Neg Hx   . Kidney disease Neg Hx   . Learning disabilities Neg Hx   . Mental illness Neg Hx   . Mental retardation Neg Hx   . Miscarriages / Stillbirths Neg Hx   . Stroke Neg Hx   . Vision loss Neg Hx   . Varicose Veins Neg  Hx    Social History  Substance Use Topics  . Smoking status: Never Smoker   . Smokeless tobacco: Never Used  . Alcohol Use: No   OB History    Gravida Para Term Preterm AB TAB SAB Ectopic Multiple Living   3 1 1  2  2   0 1     Review of Systems  Constitutional: Positive for activity change, appetite change and fatigue. Negative for fever.  HENT: Negative for congestion and rhinorrhea.   Respiratory: Positive for chest tightness and shortness of breath. Negative for cough.   Cardiovascular: Positive for chest pain and leg swelling.  Gastrointestinal: Negative for nausea, vomiting and abdominal pain.  Genitourinary: Negative for dysuria, hematuria, vaginal bleeding and vaginal discharge.  Musculoskeletal: Negative for myalgias and  arthralgias.  Skin: Negative for rash.  Neurological: Negative for weakness and headaches.  A complete 10 system review of systems was obtained and all systems are negative except as noted in the HPI and PMH.      Allergies  Adhesive  Home Medications   Prior to Admission medications   Medication Sig Start Date End Date Taking? Authorizing Provider  aspirin 81 MG chewable tablet Chew 1 tablet (81 mg total) by mouth at bedtime. 02/14/16   Rolitta Renato Battles, CNM  enoxaparin (LOVENOX) 40 MG/0.4ML injection Inject 0.6 mLs (60 mg total) into the skin daily. 02/02/16   Annia Belt, MD  folic acid (FOLVITE) 1 MG tablet Take 4 mg by mouth daily.    Historical Provider, MD  hydrochlorothiazide (HYDRODIURIL) 25 MG tablet Take 1 tablet (25 mg total) by mouth daily. 02/14/16   Rolitta Renato Battles, CNM  ibuprofen (ADVIL,MOTRIN) 600 MG tablet Take 1 tablet (600 mg total) by mouth every 6 (six) hours. 02/14/16   [name], CNM  labetalol (NORMODYNE) 100 MG tablet Take 100 mg by mouth 3 (three) times daily.    Historical Provider, MD  oxyCODONE-acetaminophen (PERCOCET/ROXICET) 5-325 MG tablet Take 1-2 tablets by mouth every 4 (four) hours as needed (pain). 02/14/16   Rolitta Renato Battles, CNM  pantoprazole (PROTONIX) 40 MG tablet Take 40 mg by mouth daily.    Historical Provider, MD  Peginterferon alfa-2a (PEGASYS [place]) Inject 1 each into the skin once a week. Patient receives injection every Monday at [place] Provider, MD  Prenatal Vit-Fe Fumarate-FA (PRENATAL MULTIVITAMIN) TABS tablet Take 1 tablet by mouth daily at 12 noon.     Historical Provider, MD   BP 146/87 mmHg  Pulse 95  Temp(Src) 99.4 F (37.4 C) (Oral)  Resp 30  Ht 5\' 6"  (1.676 m)  Wt 214 lb 4 oz (97.183 kg)  BMI 34.60 kg/m2  SpO2 96% Physical Exam  Constitutional: She is oriented to person, place, and time. She appears well-developed and well-nourished. No distress.  HENT:  Head: Normocephalic and atraumatic.  Mouth/Throat:  Oropharynx is clear and moist. No oropharyngeal exudate.  Eyes: Conjunctivae and EOM are normal. Pupils are equal, round, and reactive to light.  Neck: Normal range of motion. Neck supple.  No meningismus.  Cardiovascular: Normal rate, regular rhythm, normal heart sounds and intact distal pulses.   No murmur heard. Pulmonary/Chest: Effort normal and breath sounds normal. No respiratory distress. She has no wheezes. She exhibits tenderness.  Chest wall tenderness  Abdominal: Soft. There is no tenderness. There is no rebound and no guarding.  Musculoskeletal: Normal range of motion. She exhibits edema. She exhibits no tenderness.  +2 edema to knees  Neurological: She is alert  and oriented to person, place, and time. No cranial nerve deficit. She exhibits normal muscle tone. Coordination normal.  No ataxia on finger to nose bilaterally. No pronator drift. 5/5 strength throughout. CN 2-12 intact.Equal grip strength. Sensation intact.   Skin: Skin is warm.  Psychiatric: She has a normal mood and affect. Her behavior is normal.  Nursing note and vitals reviewed.   ED Course  Procedures (including critical care time) Labs Review Labs Reviewed  CBC WITH DIFFERENTIAL/PLATELET - Abnormal; Notable for the following:    RBC 3.81 (*)    Hemoglobin 11.0 (*)    HCT 34.3 (*)    Platelets 414 (*)    All other components within normal limits  COMPREHENSIVE METABOLIC PANEL - Abnormal; Notable for the following:    Total Protein 6.3 (*)    All other components within normal limits  URINALYSIS, ROUTINE W REFLEX MICROSCOPIC (NOT AT [facility]) - Abnormal; Notable for the following:    APPearance CLOUDY (*)    Hgb urine dipstick LARGE (*)    Protein, ur 30 (*)    Leukocytes, UA MODERATE (*)    All other components within normal limits  PROTEIN / CREATININE RATIO, URINE - Abnormal; Notable for the following:    Protein Creatinine Ratio 0.28 (*)    All other components within normal limits  URINE  MICROSCOPIC-ADD ON - Abnormal; Notable for the following:    Squamous Epithelial / LPF 0-5 (*)    Bacteria, UA MANY (*)    All other components within normal limits  BRAIN NATRIURETIC PEPTIDE - Abnormal; Notable for the following:    B Natriuretic Peptide 628.4 (*)    All other components within normal limits  I-STAT CHEM 8, ED - Abnormal; Notable for the following:    Potassium 3.4 (*)    Glucose, Bld 141 (*)    Hemoglobin 11.9 (*)    HCT 35.0 (*)    All other components within normal limits  I-STAT ARTERIAL BLOOD GAS, ED - Abnormal; Notable for the following:    pCO2 arterial 32.7 (*)    All other components within normal limits  URINE CULTURE  CULTURE, BLOOD (ROUTINE X 2)  CULTURE, BLOOD (ROUTINE X 2)  TROPONIN I  BASIC METABOLIC PANEL  MAGNESIUM  I-STAT TROPOININ, ED    Imaging Review Dg Chest 2 View  03/02/2016  CLINICAL DATA:  Shortness of breath and chest heaviness EXAM: CHEST  2 VIEW COMPARISON:  None. FINDINGS: Cardiac shadow is within normal limits. Minimal left basilar atelectasis is noted. No focal infiltrate or sizable effusion is seen. No acute bony abnormality is noted. IMPRESSION: Minimal left basilar atelectasis. Electronically Signed   By: Inez Catalina M.D.   On: 03/02/2016 17:25   Ct Angio Chest Pe W/cm &/or Wo Cm  03/02/2016  CLINICAL DATA:  Shortness of breath and chest pain 1 week. Symptoms worse over the past 3 days. Hypertension. EXAM: CT ANGIOGRAPHY CHEST WITH CONTRAST TECHNIQUE: Multidetector CT imaging of the chest was performed using the standard protocol during bolus administration of intravenous contrast. Multiplanar CT image reconstructions and MIPs were obtained to evaluate the vascular anatomy. CONTRAST:  80 mL Isovue 370 IV COMPARISON:  CT abdomen/ pelvis 08/08/2012.  Chest x-ray 03/02/2016 FINDINGS: Lungs adequately inflated demonstrate small bilateral pleural effusions. There is mild hazy attenuation of the central perihilar vessels suggesting mild  edema. Airways are within normal. There is mild cardiomegaly. Pulmonary arterial system is within normal without emboli. No evidence of hilar or mediastinal adenopathy.  Thoracic aorta is unremarkable. Remaining mediastinal structures are within normal. Bilateral breast implants are present. Images through the upper abdomen demonstrate splenomegaly. Remainder of the exam is unchanged. A Few calcifications over the right lobe of the liver unchanged. Review of the MIP images confirms the above findings. IMPRESSION: No evidence of pulmonary embolism. Cardiomegaly with small bilateral pleural effusions and suggestion of minimal interstitial edema. Splenomegaly. Electronically Signed   By: Marin Olp M.D.   On: 03/02/2016 19:28   I have personally reviewed and evaluated these images and lab results as part of my medical decision-making.   EKG Interpretation   Date/Time:  Tuesday Mar 02 2016 15:24:49 EDT Ventricular Rate:  111 PR Interval:  130 QRS Duration: 92 QT Interval:  354 QTC Calculation: 481 R Axis:   97 Text Interpretation:  Sinus tachycardia Rightward axis Borderline ECG No  previous ECGs available Confirmed by Krystall Kruckenberg  MD, Randy Whitener [phone]) on  03/02/2016 4:45:31 PM      MDM   Final diagnoses:  Acute on chronic systolic congestive heart failure (HCC)  Preeclampsia, unspecified trimester   Sent from [facility] office with Elevated BP. C/o faitgue, blurry vision, SOB, chest pressure.  EKG sinus tachycardia.  Pulmonary embolism seems less likely as patient has been taking Lovenox injections daily. She denies any shortness of breath currently. There is concern for preeclampsia with elevated BPs.  Chest x-rays negative for infiltrate or effusion.  CT scan obtained to evaluate for pulmonary embolism. This is negative. Does show small effusions, interstitial edema and cardiomegaly.  Patient's blood pressure relatively well controlled in the ED, 140s over 80s. Discussed with her obstetrician Dr.  Ronita Hipps he feels these blood pressures are fairly acceptable. He does think patient has a mild case of preeclampsia but is 3 weeks postpartum. He recommends increasing her labetalol to 300 mg 3 times daily and also giving her some diuresis. He states her urine protein/creatinine ratio is acceptable and does not recommend magnesium. He feels patient can go home with outpatient follow-up.  Upon returning from CT scan, patient had increased work of breathing and is quite dyspneic breathing about 40 times per minute with crackles at the bases. IV Lasix is given.  Cardiac US performed without significant effusion. ABG without CO2 retention.  Anxiety appears to be playing role in dyspnea.  Troponin negative x2.  Plan admission for diuresis and monitoring of respiratory status.  D/w Dr. Ronita Hipps who feels patient does not need to be transferred to [facility] as he does not feel this is 2/2 preeclampsia.  Admission d/w Dr. Myna Hidalgo. Did spike temperature after admission.  Admitting team aware.      EMERGENCY DEPARTMENT [place] CARDIAC EXAM "Study: Limited Ultrasound of the heart and pericardium"  INDICATIONS:Dyspnea Multiple views of the heart and pericardium are obtained with a multi-frequency probe.  PERFORMED [id number]  IMAGES ARCHIVED?: Yes  FINDINGS: Small effusion, Normal contractility and Tamponade physiology absent  LIMITATIONS:  Body habitus and Emergent procedure  VIEWS USED: Subcostal 4 chamber and Apical 4 chamber   INTERPRETATION: Cardiac activity present, Pericardial effusioin absent, Cardiac tamponade absent and Increased contractility  COMMENT:     Ezequiel Essex, MD 03/03/16 [phone]

## 2016-03-02 NOTE — ED Notes (Signed)
Attempted to call report x2

## 2016-03-03 ENCOUNTER — Observation Stay (HOSPITAL_BASED_OUTPATIENT_CLINIC_OR_DEPARTMENT_OTHER): Payer: BLUE CROSS/BLUE SHIELD

## 2016-03-03 ENCOUNTER — Encounter (HOSPITAL_COMMUNITY): Payer: Self-pay | Admitting: General Practice

## 2016-03-03 DIAGNOSIS — R0600 Dyspnea, unspecified: Secondary | ICD-10-CM

## 2016-03-03 DIAGNOSIS — I5021 Acute systolic (congestive) heart failure: Secondary | ICD-10-CM | POA: Diagnosis not present

## 2016-03-03 DIAGNOSIS — R509 Fever, unspecified: Secondary | ICD-10-CM | POA: Insufficient documentation

## 2016-03-03 LAB — ECHOCARDIOGRAM COMPLETE
Height: 66 in
Weight: 3345.6 oz

## 2016-03-03 LAB — BASIC METABOLIC PANEL
ANION GAP: 11 (ref 5–15)
BUN: 9 mg/dL (ref 6–20)
CHLORIDE: 105 mmol/L (ref 101–111)
CO2: 22 mmol/L (ref 22–32)
Calcium: 8.8 mg/dL — ABNORMAL LOW (ref 8.9–10.3)
Creatinine, Ser: 0.76 mg/dL (ref 0.44–1.00)
GFR calc Af Amer: >60 mL/min (ref >60)
GLUCOSE: 101 mg/dL — AB (ref 65–99)
POTASSIUM: 3.6 mmol/L (ref 3.5–5.1)
Sodium: 138 mmol/L (ref 135–145)

## 2016-03-03 LAB — MAGNESIUM: MAGNESIUM: 1.9 mg/dL (ref 1.7–2.4)

## 2016-03-03 MED ORDER — CEFTRIAXONE SODIUM 1 G IJ SOLR
1.0000 g | Freq: Once | INTRAMUSCULAR | Status: AC
  Administered 2016-03-03: 1 g via INTRAVENOUS
  Filled 2016-03-03: qty 10

## 2016-03-03 MED ORDER — LISINOPRIL 5 MG PO TABS: 5.0000 mg | ORAL_TABLET | Freq: Every day | ORAL | Status: DC

## 2016-03-03 MED ORDER — LABETALOL HCL 100 MG PO TABS: 200.0000 mg | ORAL_TABLET | Freq: Three times a day (TID) | ORAL | Status: DC

## 2016-03-03 MED ORDER — FUROSEMIDE 20 MG PO TABS: 20.0000 mg | ORAL_TABLET | Freq: Every day | ORAL | Status: AC

## 2016-03-03 NOTE — Progress Notes (Signed)
  Echocardiogram 2D Echocardiogram has been performed.  Leslie Velasquez 03/03/2016, 10:31 AM

## 2016-03-03 NOTE — Progress Notes (Signed)
Patient did not want to set up an appointment with a primary care doctor, she only wanted to see a cardiologist.  She did not see one here in the hospital and does not have one out patient but she wanted to research the local cardiologists before making any appointments.  Patient and husband given discharge instructions and all questions answered.  Patient discharged with all belongings.

## 2016-03-03 NOTE — Progress Notes (Signed)
Patient arrived to 3e10 from [facility]. A&O x 4. No complaints of pain. ST/SR on telemetry. Admit for Acute CHF. Patient Had temp of 101.4 on admission Motrin given as ordered for temp and Blood cultures obtained. No skin breakdown noted. Patient oriented to room and call system.

## 2016-03-03 NOTE — Discharge Summary (Addendum)
Physician Discharge Summary  Leslie Velasquez [id number] DOB: [birth year] DOA: 03/02/2016  PCP: No PCP Per Patient  Admit date: 03/02/2016 Discharge date: 03/03/2016  Time spent: > 35 minutes  Recommendations for Outpatient Follow-up:  1. Patient will need f/u with a cardiologist for new diagnosis of acute systolic HF   Discharge Diagnoses:  Principal Problem:   Acute Systolic CHF (congestive heart failure) ([place]) Active Problems:   Polycythemia vera ([place])   Essential thrombocythemia ([place])   Hypertension   Preeclampsia   Normocytic anemia   Hypokalemia   Pyrexia   Discharge Condition: stable  Diet recommendation: heart healthy/low sodium  Filed Weights   03/02/16 1526 03/02/16 2332 03/03/16 0640  Weight: 97.183 kg (214 lb 4 oz) 95.528 kg (210 lb 9.6 oz) 94.847 kg (209 lb 1.6 oz)    History of present illness:  45 y.o. female with medical history significant for polycythemia vera, hypertension, and mild preeclampsia with delivery by cesarean section on 02/10/2016 now presenting to the ED with worsening dyspnea upon minimal exertion. The patient's pregnancy was complicated by gestational diabetes which was diet controlled and preeclampsia, managed with labetalol. She gave birth to a healthy baby girl, their first, 3 weeks ago today and has since experienced progressive dyspnea. Dyspnea has worsened, particularly over the past day, and has been associated with orthopnea and PND.  Hospital Course:  Acute systolic HF - most likely related to recent pregnancy - continue B blocker, lisinopril, and low dose lasix. Recommended low sodium diet - Recommended pt f/u with a cardiologist after discharge.  - d/c NSAID - CT angiogram reported no evidence of PE   Otherwise for known medical conditions will continue prior to admission medication regimen  Procedures:  Echo cardiogram -  45-50%  Consultations:  None  Discharge Exam: Filed Vitals:   03/03/16 0900 03/03/16 1230  BP:  134/80 139/76  Pulse: 87 85  Temp: 98.1 F (36.7 C) 98.1 F (36.7 C)  Resp: 20 20    General: Pt in nad, alert and awake Cardiovascular: rrr, no rubs Respiratory: no increased wob, no wheezes, equal chest rise.  Discharge Instructions   Discharge Instructions    Call MD for:  difficulty breathing, headache or visual disturbances    Complete by:  As directed      Call MD for:  severe uncontrolled pain    Complete by:  As directed      Call MD for:  temperature >100.4    Complete by:  As directed      Diet - low sodium heart healthy    Complete by:  As directed      Discharge instructions    Complete by:  As directed   Please be sure to follow up with a cardiologist for further evaluation and recommendations.     Increase activity slowly    Complete by:  As directed           Current Discharge Medication List    START taking these medications   Details  furosemide (LASIX) 20 MG tablet Take 1 tablet (20 mg total) by mouth daily. Qty: 30 tablet, Refills: 0    lisinopril (PRINIVIL,ZESTRIL) 5 MG tablet Take 1 tablet (5 mg total) by mouth daily. Qty: 30 tablet, Refills: 0      CONTINUE these medications which have CHANGED   Details  labetalol (NORMODYNE) 100 MG tablet Take 2 tablets (200 mg total) by mouth 3 (three) times daily. Qty: 90 tablet, Refills: 0  CONTINUE these medications which have NOT CHANGED   Details  aspirin 81 MG chewable tablet Chew 1 tablet (81 mg total) by mouth at bedtime. Qty: 30 tablet, Refills: 0    enoxaparin (LOVENOX) 40 MG/0.4ML injection Inject 0.6 mLs (60 mg total) into the skin daily. Qty: 30 Syringe, Refills: 2   Associated Diagnoses: Polycythemia vera ([place]); Essential thrombocythemia ([place])    folic acid (FOLVITE) 1 MG tablet Take 4 mg by mouth daily.    oxyCODONE-acetaminophen (PERCOCET/ROXICET) 5-325 MG tablet Take 1-2 tablets by mouth every 4 (four) hours as needed (pain). Qty: 30 tablet, Refills: 0    pantoprazole  (PROTONIX) 40 MG tablet Take 40 mg by mouth daily.    Peginterferon alfa-2a (PEGASYS [place]) Inject 1 each into the skin once a week. Patient receives injection every Monday at [facility].    Prenatal Vit-Fe Fumarate-FA (PRENATAL MULTIVITAMIN) TABS tablet Take 1 tablet by mouth daily at 12 noon.       STOP taking these medications     hydrochlorothiazide (HYDRODIURIL) 25 MG tablet      ibuprofen (ADVIL,MOTRIN) 600 MG tablet        Allergies  Allergen Reactions  . Adhesive [Tape] Other (See Comments)    Burning/blisters      The results of significant diagnostics from this hospitalization (including imaging, microbiology, ancillary and laboratory) are listed below for reference.    Significant Diagnostic Studies: Dg Chest 2 View  03/02/2016  CLINICAL DATA:  Shortness of breath and chest heaviness EXAM: CHEST  2 VIEW COMPARISON:  None. FINDINGS: Cardiac shadow is within normal limits. Minimal left basilar atelectasis is noted. No focal infiltrate or sizable effusion is seen. No acute bony abnormality is noted. IMPRESSION: Minimal left basilar atelectasis. Electronically Signed   By: Inez Catalina M.D.   On: 03/02/2016 17:25   Ct Angio Chest Pe W/cm &/or Wo Cm  03/02/2016  CLINICAL DATA:  Shortness of breath and chest pain 1 week. Symptoms worse over the past 3 days. Hypertension. EXAM: CT ANGIOGRAPHY CHEST WITH CONTRAST TECHNIQUE: Multidetector CT imaging of the chest was performed using the standard protocol during bolus administration of intravenous contrast. Multiplanar CT image reconstructions and MIPs were obtained to evaluate the vascular anatomy. CONTRAST:  80 mL Isovue 370 IV COMPARISON:  CT abdomen/ pelvis 08/08/2012.  Chest x-ray 03/02/2016 FINDINGS: Lungs adequately inflated demonstrate small bilateral pleural effusions. There is mild hazy attenuation of the central perihilar vessels suggesting mild edema. Airways are within normal. There is mild cardiomegaly. Pulmonary arterial system  is within normal without emboli. No evidence of hilar or mediastinal adenopathy. Thoracic aorta is unremarkable. Remaining mediastinal structures are within normal. Bilateral breast implants are present. Images through the upper abdomen demonstrate splenomegaly. Remainder of the exam is unchanged. A Few calcifications over the right lobe of the liver unchanged. Review of the MIP images confirms the above findings. IMPRESSION: No evidence of pulmonary embolism. Cardiomegaly with small bilateral pleural effusions and suggestion of minimal interstitial edema. Splenomegaly. Electronically Signed   By: Marin Olp M.D.   On: 03/02/2016 19:28    Microbiology: No results found for this or any previous visit (from the past 240 hour(s)).   Labs: Basic Metabolic Panel:  Recent Labs Lab 03/02/16 1552 03/02/16 1829 03/03/16 0320  NA 139 143 138  K 4.0 3.4* 3.6  CL 107 104 105  CO2 23  --  22  GLUCOSE 93 141* 101*  BUN 11 11 9   CREATININE 0.72 0.70 0.76  CALCIUM 8.9  --  8.8*  MG  --   --  1.9   Liver Function Tests:  Recent Labs Lab 03/02/16 1552  AST 26  ALT 28  ALKPHOS 101  BILITOT 0.3  PROT 6.3*  ALBUMIN 3.5   No results for input(s): LIPASE, AMYLASE in the last 168 hours. No results for input(s): AMMONIA in the last 168 hours. CBC:  Recent Labs Lab 03/02/16 1552 03/02/16 1829  WBC 5.8  --   NEUTROABS 4.3  --   HGB 11.0* 11.9*  HCT 34.3* 35.0*  MCV 90.0  --   PLT 414*  --    Cardiac Enzymes:  Recent Labs Lab 03/02/16 1821  TROPONINI <0.03   BNP: BNP (last 3 results)  Recent Labs  03/02/16 1934  BNP 628.4*    ProBNP (last 3 results) No results for input(s): PROBNP in the last 8760 hours.  CBG: No results for input(s): GLUCAP in the last 168 hours.     Signed:  Velvet Bathe MD.  [facility] 03/03/2016, 4:27 PM

## 2016-03-05 LAB — URINE CULTURE

## 2016-03-05 NOTE — ED Provider Notes (Signed)
Urine culture results noted. Keflex prescription called for patient to [facility]. Patient aware.  Ezequiel Essex, MD 03/05/16 [phone]

## 2016-03-08 LAB — CULTURE, BLOOD (ROUTINE X 2)
CULTURE: NO GROWTH
Culture: NO GROWTH

## 2016-03-10 ENCOUNTER — Ambulatory Visit: Payer: PRIVATE HEALTH INSURANCE | Admitting: Physician Assistant

## 2016-03-15 ENCOUNTER — Other Ambulatory Visit: Payer: Self-pay | Admitting: Oncology

## 2016-04-01 ENCOUNTER — Other Ambulatory Visit: Payer: Self-pay | Admitting: Oncology

## 2016-04-09 ENCOUNTER — Ambulatory Visit (HOSPITAL_BASED_OUTPATIENT_CLINIC_OR_DEPARTMENT_OTHER): Payer: BLUE CROSS/BLUE SHIELD | Admitting: Oncology

## 2016-04-09 ENCOUNTER — Other Ambulatory Visit: Payer: PRIVATE HEALTH INSURANCE

## 2016-04-09 VITALS — BP 116/58 | HR 74 | Temp 98.3°F | Resp 18 | Ht 66.0 in | Wt 207.6 lb

## 2016-04-09 DIAGNOSIS — D6852 Prothrombin gene mutation: Secondary | ICD-10-CM

## 2016-04-09 DIAGNOSIS — E790 Hyperuricemia without signs of inflammatory arthritis and tophaceous disease: Secondary | ICD-10-CM | POA: Diagnosis not present

## 2016-04-09 DIAGNOSIS — D6859 Other primary thrombophilia: Secondary | ICD-10-CM

## 2016-04-09 DIAGNOSIS — D45 Polycythemia vera: Secondary | ICD-10-CM

## 2016-04-09 DIAGNOSIS — D473 Essential (hemorrhagic) thrombocythemia: Secondary | ICD-10-CM | POA: Diagnosis not present

## 2016-04-09 DIAGNOSIS — I5031 Acute diastolic (congestive) heart failure: Secondary | ICD-10-CM

## 2016-04-09 MED ORDER — ALLOPURINOL 300 MG PO TABS: 300.0000 mg | ORAL_TABLET | Freq: Every day | ORAL | Status: AC

## 2016-04-09 NOTE — Progress Notes (Addendum)
[place]  Telephone:([phone]) [phone] Fax:(336) [phone]     ID: NORMAJEAN NASH DOB: 11-Jan-1971  MR#: [id number]  XLK#:[id number]  Patient Care Team: Brien Few, MD as PCP - General (Obstetrics and Gynecology) Chauncey Cruel, MD as Consulting Physician (Oncology) Annia Belt, MD as Consulting Physician (Oncology) Dequincy Macarthur Critchley, MD as Consulting Physician (Oncology) Adrian Prows, MD as Consulting Physician (Cardiology) OTHER MD: Leonard Schwartz MD Joylene Igo [phone]   CHIEF COMPLAINT: Polycythemia vera/essential thrombocytosis, CHF  CURRENT TREATMENT: Pegylated interferon   HISTORY OF PRESENT ILNESS: Pattie was found to have an elevated platelet count on routine evaluation July 2013. Dr. Lavera Guise in [place] repeated the test after two of months, and found the count had risen further, to more than 600,000 platelets. She was found to not have iron deficiency or obvious inflammation. 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  Over the next year the patient's platelet count continued to rise, to greater than 1 million, and she was referred to [place] where she was evaluated by Sherlie Ban August 2014. He obtained a bone marrow biopsy 05/24/2013, which showed (([id number]) No evidence of myelofibrosis. Blasts were 2%. There was no dysplasia. Overall the bone marrow was read as consistent with essential thrombocytosis. Dr.  Rudean Hitt discussed Hydrea, anagrelide, and interferon with the patient, but given her plans for pregnancy she was continued on aspirin alone.   She had a subsequent miscarriage and has worked with GYN at [facility] and [facility] fertility specialists. 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 In all she has had three miscarriages.  As her periods were interrupted by pregnancy her  HCT started to rise and in December 2015 Dr Rudean Hitt worked her up for P vera, finding a low epo level at 1.2. Intermittent phlebotomy was started. In March 2016 with the patient pregnant she was started on peg-Interferon, initially at 45 mcg weekly, with the dose gradually increased as tolerated to the current 180 mcg/week. Though she lost that pregnancy, with IF on board and through ovarian stimulation/IVF she was able to carry a pregnancy to term, undergoing C-section for a healthy girl 02/10/2016. This was complicated by pregnancy induced diabetes mellitus and hypertension c/w mild preeclampsia.  Also in the course of this pregnancy the patient was evaluated by Dr. Murriel Hopper. He evaluated the patient for acquired von Willebrand's disorder with a normal panel documented 01/22/2016. He also recommended low molecular weight heparin for 2 weeks and to part-time and 4-6 weeks postpartum. The patient has also been evaluated by Dr. Chauncy Passy at the [facility] who discussed Ruxolinitib as an eventual possibility with the patient.  On 03/02/2016 the patient presented to the emergency room with shortness of breath, and was found to have mildly decreased systolic function at 75-64% by echocardiogram 03/03/2016. There was diffuse hypokinesis. she is being evaluated by Dr. Willaim Bane for this and he referred the patient here for further evaluation and treatment of her hematologic problems   INTERVAL HISTORY: Alishia was evaluated in the hematology clinic 04/09/2016 accompanied by her husband  Dominica Severin and their 84 month old daughter.   REVIEW OF SYSTEMS: Bobbiejo tells me her congestive heart failure appears to be improved on her current medications. She is hoping this will improve sufficiently to allow her to again become pregnant in the near future. She is concerned that  her uric acid level continues to rise. She has been on allopurinol in the past with good success. She tolerates interferon moderately well. She does have  some sweats and fatigue. She is comfortable with self administration. A detailed review of systems today was otherwise noncontributory  PAST MEDICAL HISTORY: Past Medical History  Diagnosis Date  . Asthma   . GERD (gastroesophageal reflux disease)   . 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 Personal history of chemotherapy     currently every Monday takes Interferon med  . Gestational diabetes mellitus (GDM), antepartum   . MTHFR (methylene THF reductase) deficiency and homocystinuria ([place])   . Pregnancy, supervision, high-risk 02/02/2016  . Hx of pyelonephritis   . Gestational diabetes 02/02/2016    glyburide  . Hypertension   . Acute CHF ([place]) 03/02/2016  . Cancer ([place])     polycythemia vera cancer  . Polycythemia vera ([place]) 02/02/2016  . Essential thrombocythemia ([place]) 02/02/2016    PAST SURGICAL HISTORY: Past Surgical History  Procedure Laterality Date  . Myomectomy    . Tonsillectomy    . Wisdom tooth extraction    . Colonoscopy    . Dilation and evacuation N/A 01/07/2015    Procedure: DILATATION AND EVACUATION with Tissue Sent For Chromosome Analysis;  Surgeon: Brien Few, MD;  Location: [facility];  Service: Gynecology;  Laterality: N/A;  . Cesarean section N/A 02/10/2016    Procedure: Primary CESAREAN SECTION;  Surgeon: Brien Few, MD;  Location: [place];  Service: Obstetrics;  Laterality: N/A;  EDD: 03/02/16    FAMILY HISTORY Family History  Problem Relation Age of Onset  . Diabetes Mother   . Hypertension Mother   . Hyperlipidemia Mother   . Cancer Father   . Diabetes Father   . Hypertension Father   . Hyperlipidemia Father   . Cancer Paternal Grandmother   . Cancer Paternal Grandfather   . Alcohol abuse Neg Hx   . Arthritis Neg Hx   . Asthma Neg Hx   . Birth defects Neg Hx   . COPD Neg Hx   . Drug abuse Neg Hx   . Depression Neg Hx   . Early death Neg Hx   . Hearing loss Neg Hx   . Heart disease Neg Hx   . Kidney disease  Neg Hx   . Learning disabilities Neg Hx   . Mental illness Neg Hx   . Mental retardation Neg Hx   . Miscarriages / Stillbirths Neg Hx   . Stroke Neg Hx   . Vision loss Neg Hx   . Varicose Veins Neg Hx   The patient's father died at the age of 34 with pancreatic cancer. The patient's mother is alive, age 17 as of June 2017. The patient had 2 brothers, no sisters. There is a history of late onset breast cancer on a paternal grandmother area a paternal grandfather had esophageal cancer at the age of 48. The patient is not aware of clotting problems in other family members   GYNECOLOGIC HISTORY:  No LMP recorded. Menarche age 47, the patient is G4 P1. Her periods have not resumed after her recent pregnancy. She is not breast feeding.  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 Her husband Dominica Severin is a driver for [employer]. They attend a [employer] in [place]:  not in place    HEALTH MAINTENANCE: Social History  Substance Use Topics  .  Smoking status: Never Smoker   . Smokeless tobacco: Never Used  . Alcohol Use: No     Colonoscopy: May 2016/ Richard Bloomfield at [facility]  PAP:  Bone density:   Allergies  Allergen Reactions  . Adhesive [Tape] Other (See Comments)    Burning/blisters    Current Outpatient Prescriptions  Medication Sig Dispense Refill  . carvedilol (COREG) 25 MG tablet Take 12.5 mg by mouth 2 (two) times daily with a meal.    . lisinopril-hydrochlorothiazide (PRINZIDE,ZESTORETIC) 20-25 MG tablet Take 1 tablet by mouth daily.    Marland Kitchen allopurinol (ZYLOPRIM) 300 MG tablet Take 1 tablet (300 mg total) by mouth daily. 90 tablet 4  . aspirin 81 MG chewable tablet Chew 1 tablet (81 mg total) by mouth at bedtime. 30 tablet 0  . enoxaparin (LOVENOX) 40 MG/0.4ML injection Inject 0.6 mLs (60 mg total) into the skin daily. 30 Syringe 2  . folic acid (FOLVITE) 1 MG tablet Take 4 mg by mouth daily.    . furosemide (LASIX) 20 MG tablet Take 1 tablet (20  mg total) by mouth daily. 30 tablet 0  . oxyCODONE-acetaminophen (PERCOCET/ROXICET) 5-325 MG tablet Take 1-2 tablets by mouth every 4 (four) hours as needed (pain). 30 tablet 0  . pantoprazole (PROTONIX) 40 MG tablet Take 40 mg by mouth daily.    . Peginterferon alfa-2a (PEGASYS [place]) Inject 1 each into the skin once a week. Patient receives injection every Monday at [facility].    . Prenatal Vit-Fe Fumarate-FA (PRENATAL MULTIVITAMIN) TABS tablet Take 1 tablet by mouth daily at 12 noon.      No current facility-administered medications for this visit.    OBJECTIVE: Young white woman in no acute distress  Filed Vitals:   04/09/16 1503  BP: 116/58  Pulse: 74  Temp: 98.3 F (36.8 C)  Resp: 18     Body mass index is 33.52 kg/(m^2).    ECOG FS:1 - Symptomatic but completely ambulatory  Ocular: Sclerae unicteric, pupils equal, round and reactive to light Ear-nose-throat: Oropharynx clear and moist Lymphatic: No cervical or supraclavicular adenopathy Lungs no rales or rhonchi, good excursion bilaterally Heart regular rate and rhythm, no murmur appreciated Abd soft, nontender, positive bowel sounds MSK no focal spinal tenderness, no joint edema Neuro: non-focal, well-oriented, appropriate affect Breasts:  deferred   LAB RESULTS:  CMP     Component Value Date/Time   NA 138 03/03/2016 0320   K 3.6 03/03/2016 0320   CL 105 03/03/2016 0320   CO2 22 03/03/2016 0320   GLUCOSE 101* 03/03/2016 0320   BUN 9 03/03/2016 0320   CREATININE 0.76 03/03/2016 0320   CALCIUM 8.8* 03/03/2016 0320   PROT 6.3* 03/02/2016 1552   ALBUMIN 3.5 03/02/2016 1552   AST 26 03/02/2016 1552   ALT 28 03/02/2016 1552   ALKPHOS 101 03/02/2016 1552   BILITOT 0.3 03/02/2016 1552   GFRNONAA >60 03/03/2016 0320   GFRAA >60 03/03/2016 0320    INo results found for: SPEP, UPEP  Lab Results  Component Value Date   WBC 5.8 03/02/2016   NEUTROABS 4.3 03/02/2016   HGB 11.9* 03/02/2016   HCT 35.0* 03/02/2016   MCV  90.0 03/02/2016   PLT 414* 03/02/2016      Chemistry      Component Value Date/Time   NA 138 03/03/2016 0320   K 3.6 03/03/2016 0320   CL 105 03/03/2016 0320   CO2 22 03/03/2016 0320   BUN 9 03/03/2016 0320   CREATININE 0.76 03/03/2016  0320      Component Value Date/Time   CALCIUM 8.8* 03/03/2016 0320   ALKPHOS 101 03/02/2016 1552   AST 26 03/02/2016 1552   ALT 28 03/02/2016 1552   BILITOT 0.3 03/02/2016 1552       No results found for: LABCA2  No components found for: MGQQP619  No results for input(s): INR in the last 168 hours.  Urinalysis    Component Value Date/Time   COLORURINE YELLOW 03/02/2016 1704   APPEARANCEUR CLOUDY* 03/02/2016 1704   LABSPEC 1.021 03/02/2016 1704   PHURINE 6.0 03/02/2016 1704   GLUCOSEU NEGATIVE 03/02/2016 1704   HGBUR LARGE* 03/02/2016 1704   BILIRUBINUR NEGATIVE 03/02/2016 [place] 03/02/2016 1704   PROTEINUR 30* 03/02/2016 1704   NITRITE NEGATIVE 03/02/2016 1704   LEUKOCYTESUR MODERATE* 03/02/2016 1704     STUDIES: No results found.  ELIGIBLE FOR AVAILABLE RESEARCH PROTOCOL: not locally   ASSESSMENT: 45 y.o. [place] woman with Polycythemia Vera/ Essential Thrombocytosis and a prothrombin gene mutation  (1) Presenting as essential thrombocytosis in July 2013, found to be [id number] positive, treated with low-dose aspirin  (2) with a rising HCT and documented low epo level, diagnosed as P Vera December 2015; intermittent phlebotomies started  (3) found to carry a prothrombin gene mutation while worked up for hypercoagulability at [facility] (I do not have the actual report)  (4) started on peg-IF [place] on 2016, with doses gradually increased to the current 180 g weekly  (5) Status post multiple fertility treatments, status post miscarriages 3,     (a) delivered a baby girl by C-section 50/93/2671, pregnancy complicated by DM and HTN  (6) congestive heart failure diagnosed by echocardiogram 03/03/2016   (7)  hyperuricemia secondary to increased cell turnover, with a history of gout   PLAN: We spent the better part of today's hour-long appointment discussing the biology and physiology of Elizabth's diagnosis. I also spent another hour working through her complex medical history, which has taken her to 3 tertiary care centers in addition to several local hospitals.  In brief, however, Amyria has a diagnosis of polycythemia vera. She understands the chief concern regarding this diagnosis is hypercoagulability. She apparently also has a prothrombin gene mutation diagnosed at [facility], which should be confirmed here as we don't have the actual documentation. Both these diagnoses are associated with miscarriages and fetal loss as in her case. There is also a risk of eventual transformation to acute leukemia or the development of myelofibrosis.  We discussed the treatment of polycythemia vera and specifically hydroxyurea and/or anagrelide as alternatives to interferon. Darene is very motivated to continue with the interferon because she hopes to become pregnant again, and the other alternatives are potentially damaging to the embryo. She also worries that long-term Hydrea may increase the risk of acute leukemia.  At this point what she would like is a Scientist, product/process development that she can work with on a regular basis, while keeping her contact with [facility]. Chauncy Passy at the [facility]. I think this is prudent on her part. She understands primarily what I do is breast cancer, but one of my partners, Dr. Alvy Bimler, specializes in malignant and nonmalignant hematology. Riot is agreeable to meeting with Dr. Alvy Bimler at her next visit here.  Today we reviewed her recent lab work, which shows a hematocrit of 40.0, platelet count 397,000, and white cell count of 5.3 thousand. She has mild transaminase elevation most likely secondary to the interferon. I refilled her allopurinol prescription. She gets her  lab work through [facility] as ordered by Dr. Chauncy Passy. I  added our fax number so that we would also had a copy. She understands that she should have regular smear reviews to screen for any morphologic white cell abnormality. She is due for repeat bone marrow biopsy, and this has been ordered as well.  Jireh will see [place] again in approximately one month to review the bone marrow results. She knows to call for any problems that may develop before her next visit here.  Chauncey Cruel, MD   04/09/2016 3:59 PM Medical Oncology and Hematology [facility] [address] [place], [place] [address] Tel. [phone]    Fax. [phone]   ADDENDUM: It turns out Dr.Berenzon, who used to follow Jericha at [place], is now back in [facility]. I called her and she would much prefer to be followed again by him. She would also like to cancel the bone marrow biopsy until she speaks with Dr. Rudean Hitt.    Accordingly I am canceling the appointment that we were making for her with our hematologist specialist, Dr. Alvy Bimler and the bone marrow biopsy as well. I have contacted Dr.Berenzon's office and he is being scheduled to see her next week.  I have made no further appointments for [place] here but she knows we will be glad to see her at any point in the future if we can be of help.

## 2016-04-10 DIAGNOSIS — D6852 Prothrombin gene mutation: Secondary | ICD-10-CM | POA: Insufficient documentation

## 2016-04-15 ENCOUNTER — Other Ambulatory Visit: Payer: Self-pay | Admitting: Hematology and Oncology

## 2016-04-15 ENCOUNTER — Other Ambulatory Visit: Payer: Self-pay | Admitting: Oncology

## 2016-04-18 ENCOUNTER — Encounter: Payer: Self-pay | Admitting: Oncology

## 2016-07-21 DIAGNOSIS — I5022 Chronic systolic (congestive) heart failure: Secondary | ICD-10-CM | POA: Insufficient documentation

## 2016-07-21 DIAGNOSIS — E119 Type 2 diabetes mellitus without complications: Secondary | ICD-10-CM | POA: Insufficient documentation

## 2016-12-16 DIAGNOSIS — O903 Peripartum cardiomyopathy: Secondary | ICD-10-CM | POA: Insufficient documentation

## 2017-02-20 IMAGING — CR DG CHEST 2V
2 series · 2 of 2 positions shown · non-contrast
Comparison: None.

CLINICAL DATA: Shortness of breath and chest heaviness

EXAM:
CHEST  2 VIEW

[chest pa]
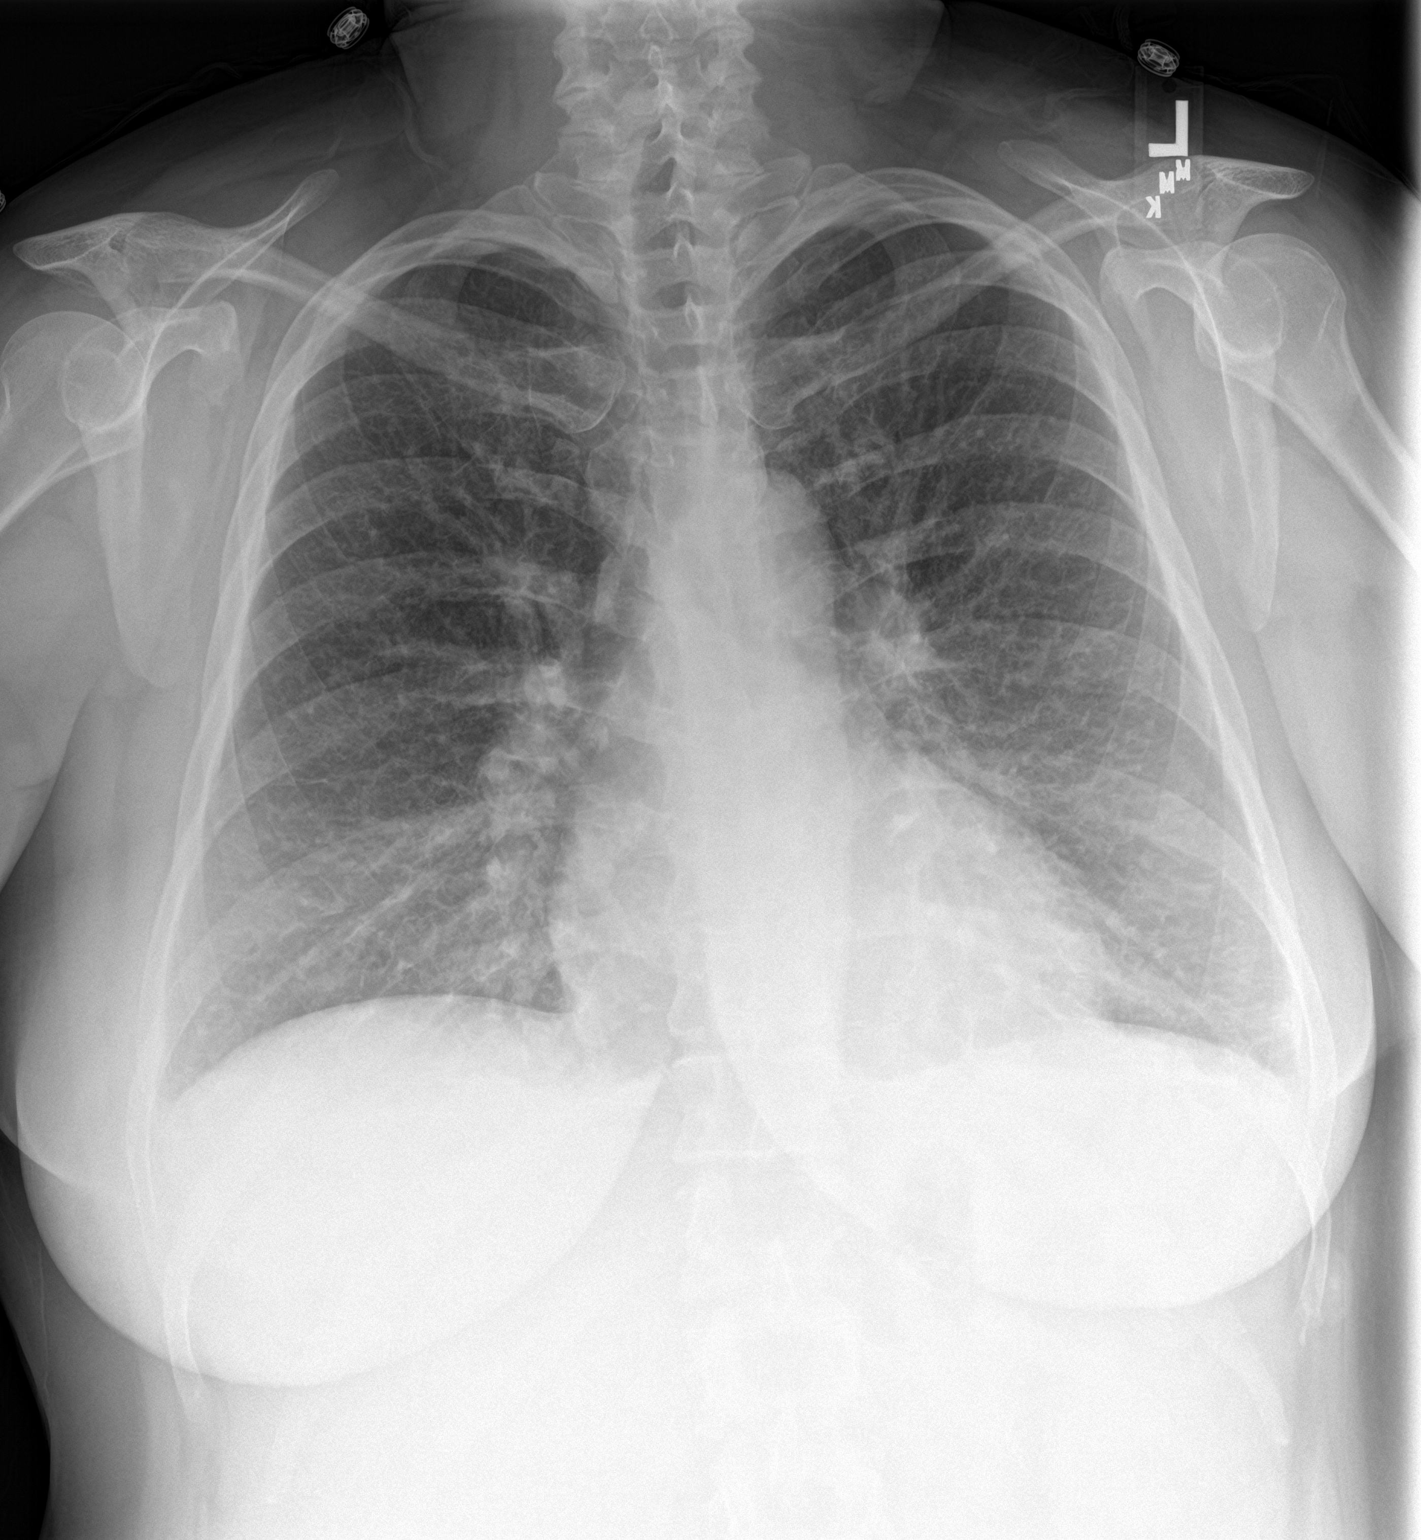

[chest lat]
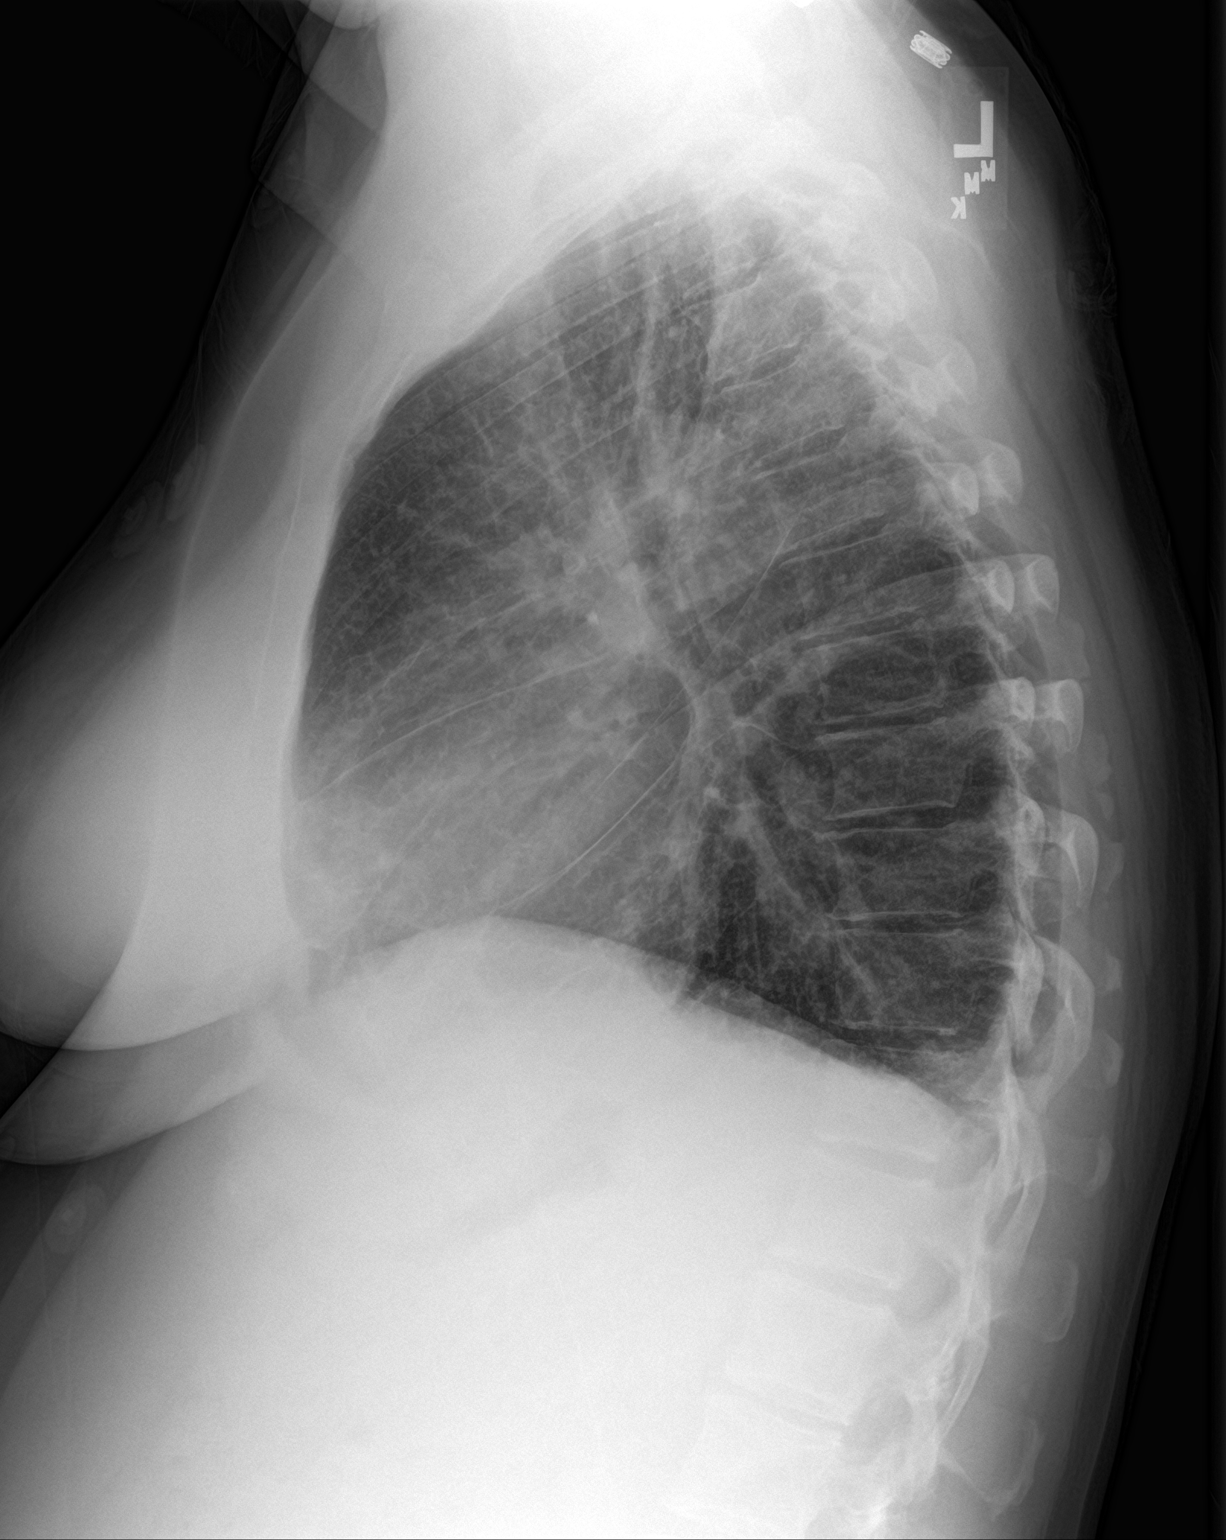

[2 of 2 positions shown; findings below may reference images not displayed]

FINDINGS: Cardiac shadow is within normal limits. Minimal left basilar
atelectasis is noted. No focal infiltrate or sizable effusion is
seen. No acute bony abnormality is noted.
IMPRESSION: Minimal left basilar atelectasis.

## 2017-02-20 IMAGING — CT CT ANGIO CHEST
2 of 7 series · 18 of 36 positions shown · IV contrast (Omni 300)
Comparison: CT abdomen/ pelvis 08/08/2012.  Chest x-ray 03/02/2016

CLINICAL DATA: Shortness of breath and chest pain 1 week. Symptoms
worse over the past 3 days. Hypertension.

EXAM:
CT ANGIOGRAPHY CHEST WITH CONTRAST
TECHNIQUE: Multidetector CT imaging of the chest was performed using the
standard protocol during bolus administration of intravenous
contrast. Multiplanar CT image reconstructions and MIPs were
obtained to evaluate the vascular anatomy.
CONTRAST:  80 mL Isovue 370 IV

[Series 6: pe thins · axial · 0.67mm/px · z∈[+1076,+1345]mm · 17 of 607 slices shown]
[im 34/607  lung]
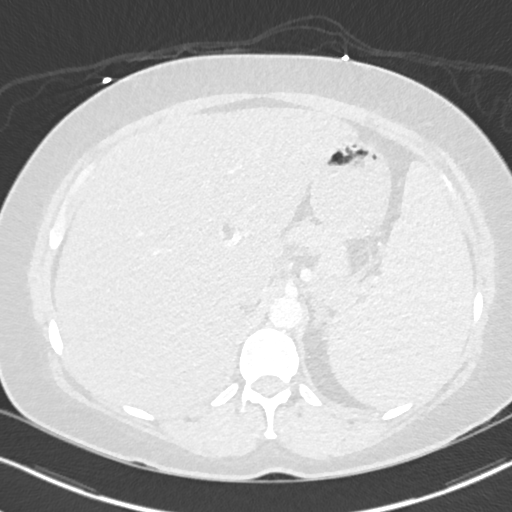
[im 68/607  mediastinal]
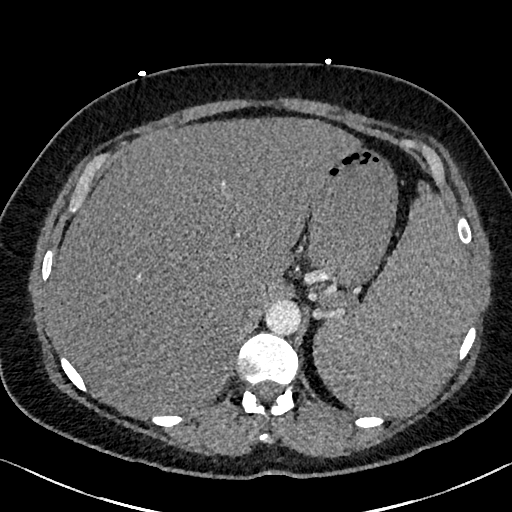
[im 102/607  lung]
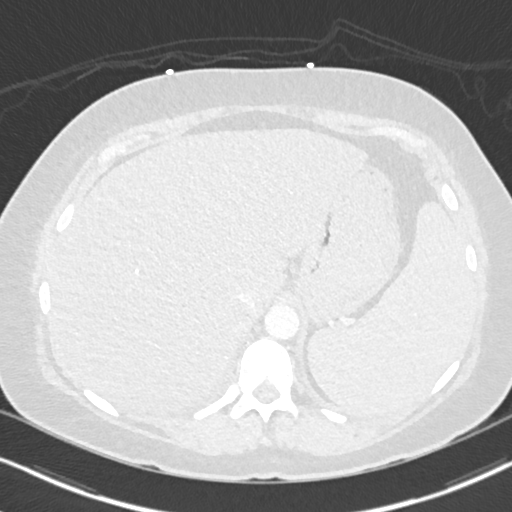
[im 135/607  mediastinal]
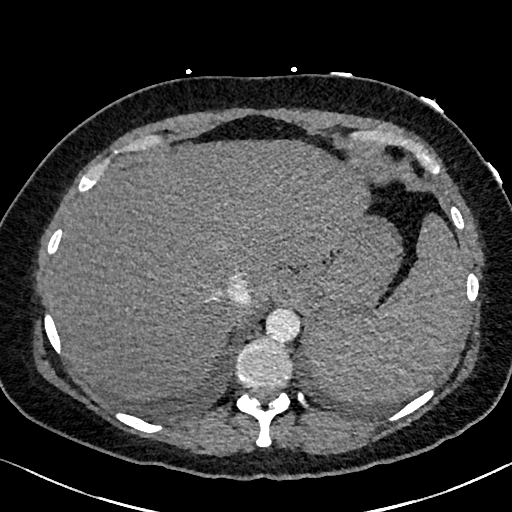
[im 169/607  lung]
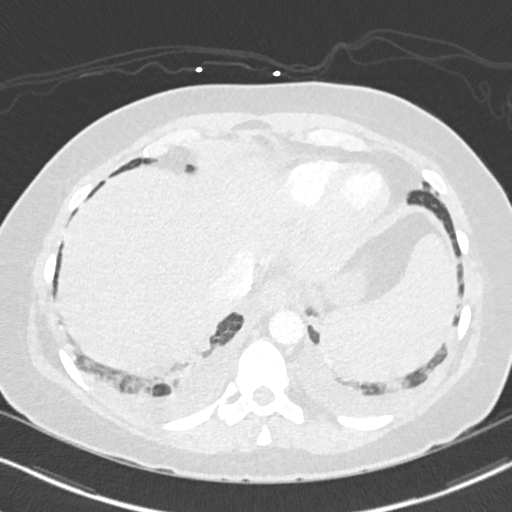
[im 203/607  mediastinal]
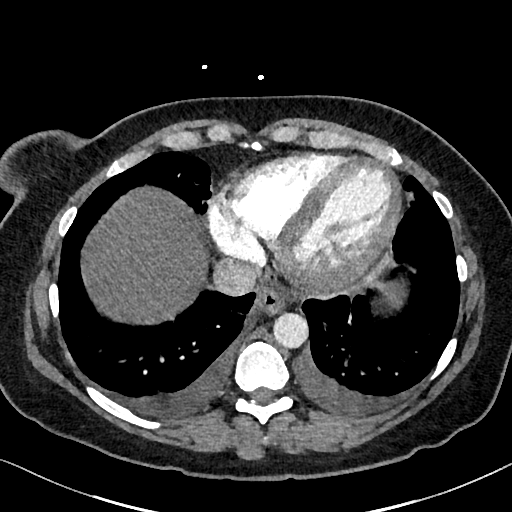
[im 236/607  lung]
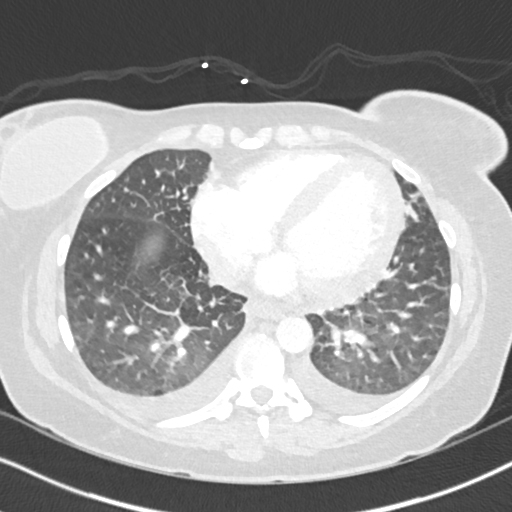
[im 270/607  mediastinal]
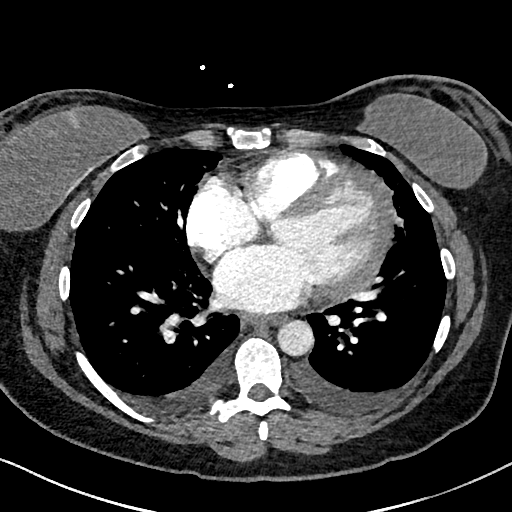
[im 304/607  lung]
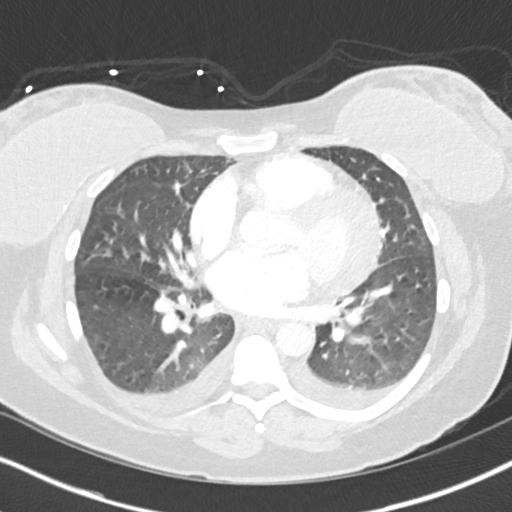
[im 337/607  mediastinal]
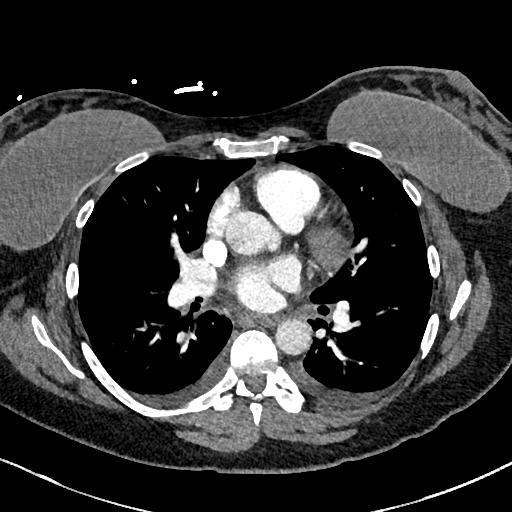
[im 371/607  lung]
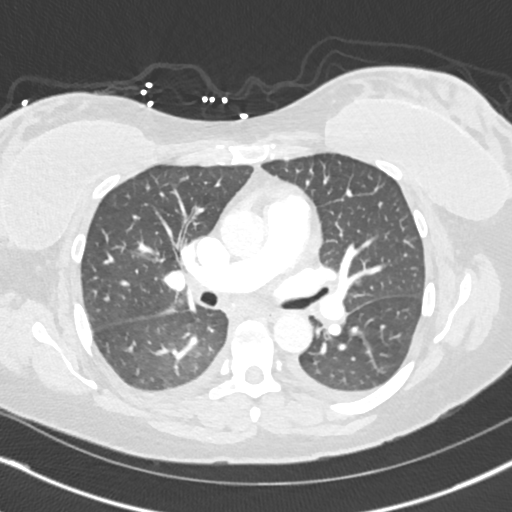
[im 405/607  mediastinal]
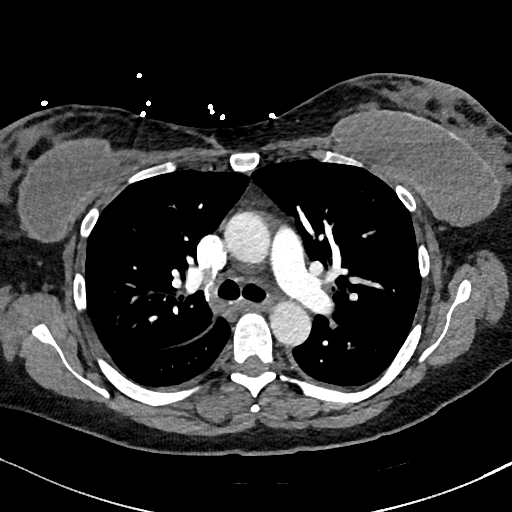
[im 438/607  lung]
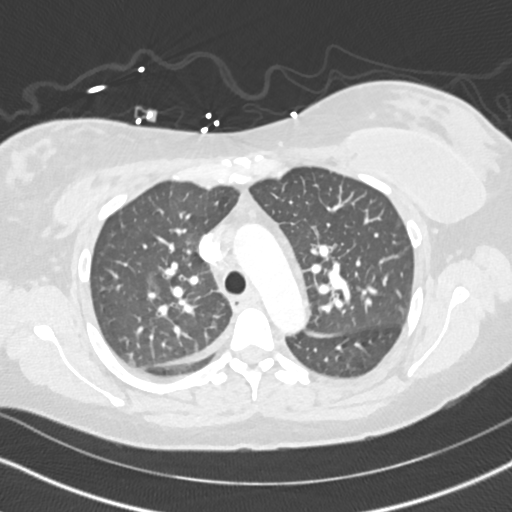
[im 472/607  mediastinal]
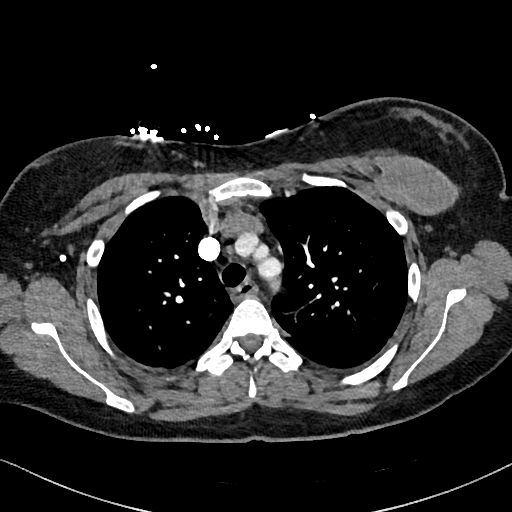
[im 505/607  lung]
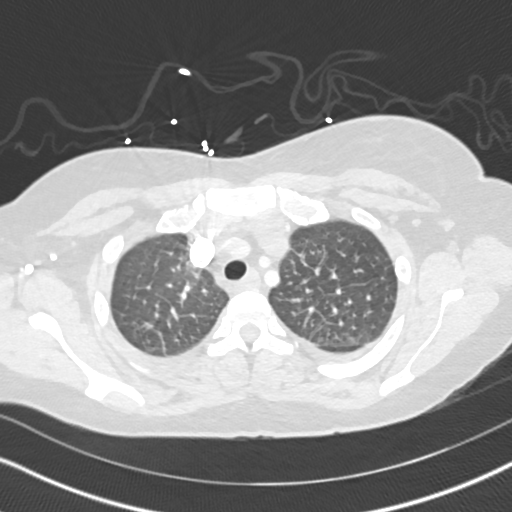
[im 539/607  mediastinal]
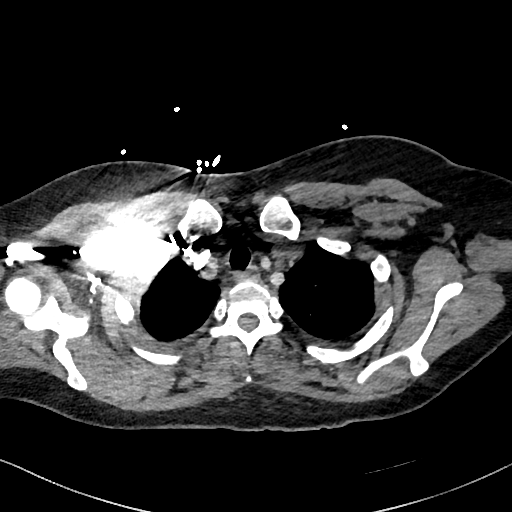
[im 573/607  lung]
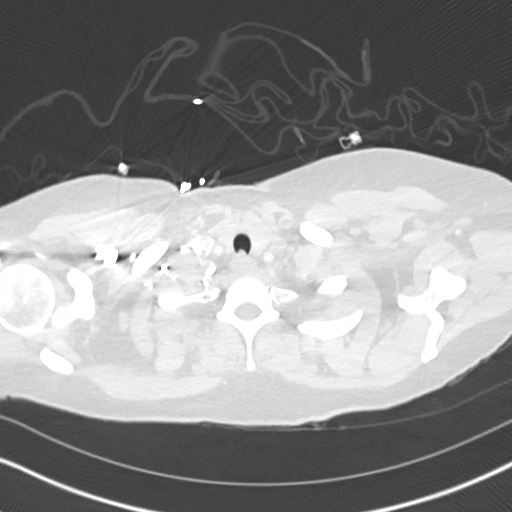

[Series 7: pe 2mm cor · coronal · 0.59mm/px · 1 of 113 slices shown]
[im 57/113  mediastinal]
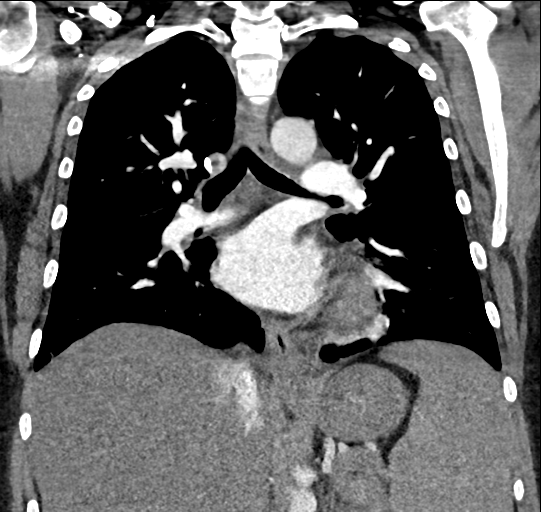

[18 of 36 positions shown; findings below may reference images not displayed]

FINDINGS: Lungs adequately inflated demonstrate small bilateral pleural
effusions. There is mild hazy attenuation of the central perihilar
vessels suggesting mild edema. Airways are within normal.

There is mild cardiomegaly. Pulmonary arterial system is within
normal without emboli. No evidence of hilar or mediastinal
adenopathy. Thoracic aorta is unremarkable. Remaining mediastinal
structures are within normal. Bilateral breast implants are present.

Images through the upper abdomen demonstrate splenomegaly. Remainder
of the exam is unchanged. A Few calcifications over the right lobe
of the liver unchanged.

Review of the MIP images confirms the above findings.
IMPRESSION: No evidence of pulmonary embolism.

Cardiomegaly with small bilateral pleural effusions and suggestion
of minimal interstitial edema.

Splenomegaly.

## 2017-06-24 ENCOUNTER — Other Ambulatory Visit: Payer: Self-pay | Admitting: Oncology

## 2018-03-24 NOTE — Telephone Encounter (Signed)
Preadmission screen  

## 2018-11-09 ENCOUNTER — Ambulatory Visit (INDEPENDENT_AMBULATORY_CARE_PROVIDER_SITE_OTHER): Payer: BLUE CROSS/BLUE SHIELD

## 2018-11-09 ENCOUNTER — Ambulatory Visit: Payer: BLUE CROSS/BLUE SHIELD | Admitting: Sports Medicine

## 2018-11-09 ENCOUNTER — Other Ambulatory Visit: Payer: Self-pay

## 2018-11-09 ENCOUNTER — Encounter: Payer: Self-pay | Admitting: Sports Medicine

## 2018-11-09 VITALS — BP 106/57 | HR 73 | Ht 65.0 in | Wt 190.0 lb

## 2018-11-09 DIAGNOSIS — M779 Enthesopathy, unspecified: Secondary | ICD-10-CM | POA: Diagnosis not present

## 2018-11-09 DIAGNOSIS — M79672 Pain in left foot: Secondary | ICD-10-CM | POA: Diagnosis not present

## 2018-11-09 DIAGNOSIS — M205X1 Other deformities of toe(s) (acquired), right foot: Secondary | ICD-10-CM

## 2018-11-09 DIAGNOSIS — M79671 Pain in right foot: Secondary | ICD-10-CM

## 2018-11-09 DIAGNOSIS — Z8739 Personal history of other diseases of the musculoskeletal system and connective tissue: Secondary | ICD-10-CM

## 2018-11-09 DIAGNOSIS — M2011 Hallux valgus (acquired), right foot: Secondary | ICD-10-CM

## 2018-11-09 MED ORDER — TRIAMCINOLONE ACETONIDE 10 MG/ML IJ SUSP
10.0000 mg | Freq: Once | INTRAMUSCULAR | Status: AC
  Administered 2018-11-09: 10 mg

## 2018-11-09 NOTE — Progress Notes (Signed)
Subjective: Leslie Velasquez is a 48 y.o. female patient who presents to office for evaluation of bilateral foot pain. Patient complains of progressive pain especially over the last 5 months in the left foot at the ball of the foot that is 7/10 sharp in nature worse when walking barefoot. Patient has tried motrin with no relief. Patient reports that she also has pain off and on at right big toe joint, states that pain is under the toe and at side of joint. Admits to history of gout on Allopurinol. Denies injury/trip/fall/sprain/any causative factors.   Review of Systems  Musculoskeletal: Positive for joint pain and myalgias.  All other systems reviewed and are negative.    Patient Active Problem List   Diagnosis Date Noted  . 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 Chronic systolic heart failure ([place]) 07/21/2016  . Type 2 diabetes mellitus without complication, without long-term current use of insulin ([place]) 07/21/2016  . Prothrombin gene mutation ([place]) 04/10/2016  . Pyrexia   . Acute CHF (congestive heart failure) ([place]) 03/02/2016  . Hypertension 03/02/2016  . Preeclampsia 03/02/2016  . Normocytic anemia 03/02/2016  . Hypokalemia 03/02/2016  . Postpartum care following cesarean delivery (02/10/16) 02/11/2016  . Acute blood loss anemia 02/11/2016  . Polycythemia vera ([place]) 02/02/2016  . Essential thrombocythemia ([place]) 02/02/2016  . Pregnancy, supervision, high-risk 02/02/2016  . Gestational diabetes 02/02/2016    Current Outpatient Medications on File Prior to Visit  Medication Sig Dispense Refill  . allopurinol (ZYLOPRIM) 300 MG tablet Take 1 tablet (300 mg total) by mouth daily. 90 tablet 4  . aspirin 81 MG chewable tablet Chew 1 tablet (81 mg total) by mouth at bedtime. 30 tablet 0  . folic acid (FOLVITE) 1 MG tablet Take 4 mg by mouth daily.    . furosemide (LASIX) 20 MG tablet Take 1 tablet (20 mg total) by mouth daily. 30 tablet 0  . pantoprazole (PROTONIX) 40 MG tablet Take 40 mg  by mouth daily.    . Prenatal Vit-Fe Fumarate-FA (PRENATAL MULTIVITAMIN) TABS tablet Take 1 tablet by mouth daily at 12 noon.      No current facility-administered medications on file prior to visit.     Allergies  Allergen Reactions  . Tapentadol Other (See Comments)    Other reaction(s): Other (See Comments) Burning/blisters Other reaction(s): Other (See Comments) Burning/blisters   . Tape Other (See Comments) and Rash    Burning/blisters Other reaction(s): Other (See Comments) Burning/blisters Other reaction(s): Other (See Comments) Burning/blisters    Objective:  General: Alert and oriented x3 in no acute distress  Dermatology: No open lesions bilateral lower extremities, no webspace macerations, no ecchymosis bilateral, all nails x 10 are well manicured.  Vascular: Dorsalis Pedis and Posterior Tibial pedal pulses palpable, Capillary Fill Time 3 seconds,(+) pedal hair growth bilateral, no edema bilateral lower extremities, Temperature gradient within normal limits.  Neurology: Gross sensation intact via light touch bilateral.  Musculoskeletal: Moderate tenderness with palpation at Left sub met 3 and 4th webspace, there is mild pain to right 1st MTPJ and at sesamoid complex at right foot,No pain with calf compression bilateral. There is decreased ankle rom with knee extending  vs flexed resembling gastroc equnius bilateral, Subtalar joint range of motion is within normal limits, there is no 1st ray hypermobility noted bilateral, decreased 1st MPJ rom Right>Left with functional limitus noted on weightbearing exam. Strength within normal limits in all groups bilateral.   Gait: Antalgic gait  Xrays  Left and Right Foot   Impression:Normal  osseous mineralization, mild 1st MTPJ space narrowing on right, no other acute findings. No other issues noted.   Assessment and Plan: Problem List Items Addressed This Visit    None    Visit Diagnoses    Left foot pain    -  Primary    Relevant Medications   triamcinolone acetonide (KENALOG) 10 MG/ML injection 10 mg (Completed) (Start on 11/09/2018  2:15 PM)   Other Relevant Orders   DG Foot Complete Left   Right foot pain       Relevant Orders   DG Foot Complete Right   Capsulitis       Relevant Medications   triamcinolone acetonide (KENALOG) 10 MG/ML injection 10 mg (Completed) (Start on 11/09/2018  2:15 PM)   Hallux limitus of right foot       History of gout           -Complete examination performed -Xrays reviewed -Discussed treatement options for capsulitis on left and limitus on Right -After oral consent and aseptic prep, injected a mixture containing 1 ml of 2%  plain lidocaine, 1 ml 0.5% plain marcaine, 0.5 ml of kenalog 10 and 0.5 ml of dexamethasone phosphate into left 3rd MTPJ at lateral aspect without complication. Post-injection care discussed with patient.  -Rx met padding sleeve bilateral however patient only decided to get one sleeve for the left -Recommend rest, ice, elevation, topical pain cream OTC, epsom soaks PRN -Continue gout medications -Advised patient may benefit in future from orthotics  -Patient to return to office in 3-4 weeks or sooner if condition worsens.  Landis Martins, DPM

## 2018-11-09 NOTE — Progress Notes (Signed)
   Subjective:    Patient ID: Leslie Velasquez, female    DOB: May 17, 1971, 48 y.o.   MRN: [id number]  HPI    Review of Systems  Musculoskeletal: Positive for arthralgias and myalgias.  All other systems reviewed and are negative.      Objective:   Physical Exam        Assessment & Plan:

## 2018-11-15 ENCOUNTER — Other Ambulatory Visit: Payer: Self-pay | Admitting: Sports Medicine

## 2018-11-15 DIAGNOSIS — M779 Enthesopathy, unspecified: Secondary | ICD-10-CM

## 2018-11-15 DIAGNOSIS — M79672 Pain in left foot: Secondary | ICD-10-CM

## 2018-11-15 DIAGNOSIS — M79671 Pain in right foot: Secondary | ICD-10-CM

## 2018-11-15 DIAGNOSIS — M2011 Hallux valgus (acquired), right foot: Secondary | ICD-10-CM

## 2018-12-07 ENCOUNTER — Encounter: Payer: Self-pay | Admitting: Sports Medicine

## 2018-12-07 ENCOUNTER — Ambulatory Visit: Payer: BLUE CROSS/BLUE SHIELD | Admitting: Sports Medicine

## 2018-12-07 DIAGNOSIS — M722 Plantar fascial fibromatosis: Secondary | ICD-10-CM

## 2018-12-07 DIAGNOSIS — M79672 Pain in left foot: Secondary | ICD-10-CM

## 2018-12-07 DIAGNOSIS — M79671 Pain in right foot: Secondary | ICD-10-CM | POA: Diagnosis not present

## 2018-12-07 DIAGNOSIS — M205X1 Other deformities of toe(s) (acquired), right foot: Secondary | ICD-10-CM

## 2018-12-07 DIAGNOSIS — M779 Enthesopathy, unspecified: Secondary | ICD-10-CM | POA: Diagnosis not present

## 2018-12-07 MED ORDER — TRIAMCINOLONE ACETONIDE 10 MG/ML IJ SUSP
10.0000 mg | Freq: Once | INTRAMUSCULAR | Status: AC
  Administered 2018-12-07: 10 mg

## 2018-12-07 NOTE — Progress Notes (Signed)
Subjective: ALONDRA VANDEVEN is a 48 y.o. female patient who returns to office for follow up evaluation of bilateral foot pain. Patient reports pain is about 60% better states that it still hurts a little on the left but much better than before.  Patient reports that her gout pain is over on the right.  Patient denies any new pedal complaints at this time.   Patient Active Problem List   Diagnosis Date Noted  . 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 Chronic systolic heart failure ([place]) 07/21/2016  . Type 2 diabetes mellitus without complication, without long-term current use of insulin ([place]) 07/21/2016  . Prothrombin gene mutation ([place]) 04/10/2016  . Pyrexia   . Acute CHF (congestive heart failure) ([place]) 03/02/2016  . Hypertension 03/02/2016  . Preeclampsia 03/02/2016  . Normocytic anemia 03/02/2016  . Hypokalemia 03/02/2016  . Postpartum care following cesarean delivery (02/10/16) 02/11/2016  . Acute blood loss anemia 02/11/2016  . Polycythemia vera ([place]) 02/02/2016  . Essential thrombocythemia ([place]) 02/02/2016  . Pregnancy, supervision, high-risk 02/02/2016  . Gestational diabetes 02/02/2016    Current Outpatient Medications on File Prior to Visit  Medication Sig Dispense Refill  . allopurinol (ZYLOPRIM) 300 MG tablet Take 1 tablet (300 mg total) by mouth daily. 90 tablet 4  . aspirin 81 MG chewable tablet Chew 1 tablet (81 mg total) by mouth at bedtime. 30 tablet 0  . folic acid (FOLVITE) 1 MG tablet Take 4 mg by mouth daily.    . furosemide (LASIX) 20 MG tablet Take 1 tablet (20 mg total) by mouth daily. 30 tablet 0  . lisinopril (PRINIVIL,ZESTRIL) 20 MG tablet     . metFORMIN (GLUCOPHAGE) 500 MG tablet     . metoprolol succinate (TOPROL-XL) 50 MG 24 hr tablet     . nabumetone (RELAFEN) 750 MG tablet     . oxyCODONE (ROXICODONE) 15 MG immediate release tablet TK 1 T PO Q 8 H PRN    . pantoprazole (PROTONIX) 40 MG tablet Take 40 mg by mouth daily.    . Prenatal Vit-Fe Fumarate-FA  (PRENATAL MULTIVITAMIN) TABS tablet Take 1 tablet by mouth daily at 12 noon.     . sertraline (ZOLOFT) 50 MG tablet     . VASCEPA 1 g CAPS     . VICTOZA 18 MG/3ML SOPN      No current facility-administered medications on file prior to visit.     Allergies  Allergen Reactions  . Tapentadol Other (See Comments)    Other reaction(s): Other (See Comments) Burning/blisters Other reaction(s): Other (See Comments) Burning/blisters   . Tape Other (See Comments) and Rash    Burning/blisters Other reaction(s): Other (See Comments) Burning/blisters Other reaction(s): Other (See Comments) Burning/blisters    Objective:  General: Alert and oriented x3 in no acute distress  Dermatology: No open lesions bilateral lower extremities, no webspace macerations, no ecchymosis bilateral, all nails x 10 are well manicured.  Vascular: Dorsalis Pedis and Posterior Tibial pedal pulses palpable, Capillary Fill Time 3 seconds,(+) pedal hair growth bilateral, no edema bilateral lower extremities, Temperature gradient within normal limits.  Neurology: Gross sensation intact via light touch bilateral.  Musculoskeletal: Mild tenderness with palpation at Left sub met 3 and 4th webspace, there is minimal pain to right 1st MTPJ and at sesamoid complex at right foot,No pain with calf compression bilateral. There is decreased ankle rom with knee extending  vs flexed resembling gastroc equnius bilateral, Subtalar joint range of motion is within normal limits, there is no 1st ray  hypermobility noted bilateral, decreased 1st MPJ rom Right>Left with functional limitus noted on weightbearing exam. Strength within normal limits in all groups bilateral.   Assessment and Plan: Problem List Items Addressed This Visit    None    Visit Diagnoses    Left foot pain    -  Primary   Capsulitis       Bilateral plantar fasciitis       Right foot pain       Hallux limitus of right foot         -Complete examination  performed -Re-Discussed treatement options for capsulitis on left and limitus on Right -After oral consent and aseptic prep, injected a mixture containing 1 ml of 2%  plain lidocaine, 1 ml 0.5% plain marcaine, 0.5 ml of kenalog 10 and 0.5 ml of dexamethasone phosphate into left 3rd MTPJ at lateral and into webspace aspect without complication.  This is injection #2 to area.  Post-injection care discussed with patient.  -Orthotic benefits will be checked and if patient decides that she wants orthotics we will move forward with arranging patient to come back to be scanned for them -Recommend rest, ice, elevation, topical pain cream OTC, epsom soaks PRN of which she has not tried yet however advised patient to try if the area continues to be painful -Continue gout medications -Patient to return to office for casting for orthotics or sooner if condition worsens.  Landis Martins, DPM

## 2020-12-03 ENCOUNTER — Ambulatory Visit: Payer: Self-pay | Admitting: Orthopedic Surgery

## 2020-12-09 ENCOUNTER — Other Ambulatory Visit (HOSPITAL_COMMUNITY)
Admission: RE | Admit: 2020-12-09 | Discharge: 2020-12-09 | Disposition: A | Discharge status: Home / Self Care | Payer: BC Managed Care – PPO | Source: Ambulatory Visit | Attending: Specialist | Admitting: Specialist

## 2020-12-09 ENCOUNTER — Other Ambulatory Visit: Payer: Self-pay

## 2020-12-09 ENCOUNTER — Encounter (HOSPITAL_COMMUNITY)
Admission: RE | Admit: 2020-12-09 | Discharge: 2020-12-09 | Disposition: A | Discharge status: Home / Self Care | Payer: BC Managed Care – PPO | Source: Ambulatory Visit | Attending: Specialist | Admitting: Specialist

## 2020-12-09 ENCOUNTER — Encounter (HOSPITAL_COMMUNITY): Payer: Self-pay

## 2020-12-09 DIAGNOSIS — Z01812 Encounter for preprocedural laboratory examination: Secondary | ICD-10-CM | POA: Insufficient documentation

## 2020-12-09 DIAGNOSIS — Z20822 Contact with and (suspected) exposure to covid-19: Secondary | ICD-10-CM | POA: Insufficient documentation

## 2020-12-09 DIAGNOSIS — M5126 Other intervertebral disc displacement, lumbar region: Secondary | ICD-10-CM | POA: Insufficient documentation

## 2020-12-09 DIAGNOSIS — I10 Essential (primary) hypertension: Secondary | ICD-10-CM | POA: Insufficient documentation

## 2020-12-09 DIAGNOSIS — Z01818 Encounter for other preprocedural examination: Secondary | ICD-10-CM | POA: Insufficient documentation

## 2020-12-09 LAB — BASIC METABOLIC PANEL
Anion gap: 11 (ref 5–15)
BUN: 17 mg/dL (ref 6–20)
CO2: 25 mmol/L (ref 22–32)
Calcium: 8.9 mg/dL (ref 8.9–10.3)
Chloride: 100 mmol/L (ref 98–111)
Creatinine, Ser: 0.9 mg/dL (ref 0.44–1.00)
GFR, Estimated: >60 mL/min (ref >60)
Glucose, Bld: 91 mg/dL (ref 70–99)
Potassium: 3.5 mmol/L (ref 3.5–5.1)
Sodium: 136 mmol/L (ref 135–145)

## 2020-12-09 LAB — CBC
HCT: 39.7 % (ref 36.0–46.0)
Hemoglobin: 13.1 g/dL (ref 12.0–15.0)
MCH: 32.6 pg (ref 26.0–34.0)
MCHC: 33 g/dL (ref 30.0–36.0)
MCV: 98.8 fL (ref 80.0–100.0)
Platelets: 510 10*3/uL — ABNORMAL HIGH (ref 150–400)
RBC: 4.02 MIL/uL (ref 3.87–5.11)
RDW: 13.8 % (ref 11.5–15.5)
WBC: 10.6 10*3/uL — ABNORMAL HIGH (ref 4.0–10.5)
nRBC: 0 % (ref 0.0–0.2)

## 2020-12-09 LAB — SURGICAL PCR SCREEN
MRSA, PCR: NEGATIVE
Staphylococcus aureus: NEGATIVE

## 2020-12-09 LAB — GLUCOSE, CAPILLARY: Glucose-Capillary: 106 mg/dL — ABNORMAL HIGH (ref 70–99)

## 2020-12-09 NOTE — Progress Notes (Signed)
PCP: Herbie [place]  Cardiologist: Denies  EKG: 12/09/20 CXR: 03/02/16 ECHO:03/03/16 Stress Test: 02/16/17 CE Cardiac Cath: Denies  Fasting Blood Sugar- 90-110 Checks Blood Sugar__2_ times a week  OSA/CPAP:  No  ASA:  Last dose 12/05/20 Blood Thinner:  No  Covid test 12/09/20  Anesthesia Review:  Yes, cardiac history.    Patient denies shortness of breath, fever, cough, and chest pain at PAT appointment.  Patient verbalized understanding of instructions provided today at the PAT appointment.  Patient asked to review instructions at home and day of surgery.

## 2020-12-09 NOTE — Progress Notes (Signed)
Surgical Instructions    Your procedure is scheduled on 12/12/20.  Report to [facility] Main Entrance "A" at 11:00 A.M., then check in with the Admitting office.  Call this number if you have problems the morning of surgery:  [phone]   If you have any questions prior to your surgery date call [phone]: Open Monday-Friday 8am-4pm    Remember:  Do not eat after midnight the night before your surgery  You may drink clear liquids until 10:00 the morning of your surgery.   Clear liquids allowed are: Water, Non-Citrus Juices (without pulp), Carbonated Beverages, Clear Tea, Black Coffee Only, and Gatorade  Please complete your bottle of water  that was provided to you by 10:00 the morning of surgery.  Please, if able, drink it in one setting. DO NOT SIP.    Take these medicines the morning of surgery with A SIP OF WATER: allopurinol (ZYLOPRIM) sertraline (ZOLOFT)   IF NEEDED: acetaminophen (TYLENOL)  albuterol (VENTOLIN HFA) ondansetron (ZOFRAN-ODT) oxycodone (ROXICODONE)   As of today, STOP taking any Aspirin (unless otherwise instructed by your surgeon) Aleve, Naproxen, Ibuprofen, Motrin, Advil, Goody's, BC's, all herbal medications, fish oil, and all vitamins.   WHAT DO I DO ABOUT MY DIABETES MEDICATION?   Marland Kitchen Do not take oral diabetes medicines (pills) the morning of surgery.  . The day of surgery, do not take other diabetes injectables, including Byetta (exenatide), Bydureon (exenatide ER), Victoza (liraglutide), or Trulicity (dulaglutide).  . I . HOW TO MANAGE YOUR DIABETES BEFORE AND AFTER SURGERY  Why is it important to control my blood sugar before and after surgery? . Improving blood sugar levels before and after surgery helps healing and can limit problems. . A way of improving blood sugar control is eating a healthy diet by: o  Eating less sugar and carbohydrates o  Increasing activity/exercise o  Talking with your doctor about reaching your blood sugar  goals . High blood sugars (greater than 180 mg/dL) can raise your risk of infections and slow your recovery, so you will need to focus on controlling your diabetes during the weeks before surgery. . Make sure that the doctor who takes care of your diabetes knows about your planned surgery including the date and location.  How do I manage my blood sugar before surgery? . Check your blood sugar at least 4 times a day, starting 2 days before surgery, to make sure that the level is not too high or low. . Check your blood sugar the morning of your surgery when you wake up and every 2 hours until you get to the Short Stay unit. o If your blood sugar is less than 70 mg/dL, you will need to treat for low blood sugar: - Do not take insulin. - Treat a low blood sugar (less than 70 mg/dL) with  cup of clear juice (cranberry or apple), 4 glucose tablets, OR glucose gel. - Recheck blood sugar in 15 minutes after treatment (to make sure it is greater than 70 mg/dL). If your blood sugar is not greater than 70 mg/dL on recheck, call [phone] for further instructions. . Report your blood sugar to the short stay nurse when you get to Short Stay.  . If you are admitted to the hospital after surgery: o Your blood sugar will be checked by the staff and you will probably be given insulin after surgery (instead of oral diabetes medicines) to make sure you have good blood sugar levels. o The goal for blood sugar control after  surgery is 80-180 mg/dL.                      Do not wear jewelry, make up, or nail polish            Do not wear lotions, powders, perfumes or deodorant.            Do not shave 48 hours prior to surgery.              Do not bring valuables to the hospital.            [facility] is not responsible for any belongings or valuables.  Do NOT Smoke (Tobacco/Vaping) or drink Alcohol 24 hours prior to your procedure If you use a CPAP at night, you may bring all equipment for your overnight  stay.   Contacts, glasses, dentures or bridgework may not be worn into surgery, please bring cases for these belongings   For patients admitted to the hospital, discharge time will be determined by your treatment team.   Patients discharged the day of surgery will not be allowed to drive home, and someone needs to stay with them for 24 hours.    Special instructions:   [place]- Preparing For Surgery  Before surgery, you can play an important role. Because skin is not sterile, your skin needs to be as free of germs as possible. You can reduce the number of germs on your skin by washing with CHG (chlorahexidine gluconate) Soap before surgery.  CHG is an antiseptic cleaner which kills germs and bonds with the skin to continue killing germs even after washing.    Oral Hygiene is also important to reduce your risk of infection.  Remember - BRUSH YOUR TEETH THE MORNING OF SURGERY WITH YOUR REGULAR TOOTHPASTE  Please do not use if you have an allergy to CHG or antibacterial soaps. If your skin becomes reddened/irritated stop using the CHG.  Do not shave (including legs and underarms) for at least 48 hours prior to first CHG shower. It is OK to shave your face.  Please follow these instructions carefully.   1. Shower the NIGHT BEFORE SURGERY and the MORNING OF SURGERY  2. If you chose to wash your hair, wash your hair first as usual with your normal shampoo.  3. After you shampoo, rinse your hair and body thoroughly to remove the shampoo.  4. Wash Face and genitals (private parts) with your normal soap.   5.  Shower the NIGHT BEFORE SURGERY and the MORNING OF SURGERY with CHG Soap.   6. Use CHG Soap as you would any other liquid soap. You can apply CHG directly to the skin and wash gently with a scrungie or a clean washcloth.   7. Apply the CHG Soap to your body ONLY FROM THE NECK DOWN.  Do not use on open wounds or open sores. Avoid contact with your eyes, ears, mouth and genitals  (private parts). Wash Face and genitals (private parts)  with your normal soap.   8. Wash thoroughly, paying special attention to the area where your surgery will be performed.  9. Thoroughly rinse your body with warm water from the neck down.  10. DO NOT shower/wash with your normal soap after using and rinsing off the CHG Soap.  11. Pat yourself dry with a CLEAN TOWEL.  12. Wear CLEAN PAJAMAS to bed the night before surgery  13. Place CLEAN SHEETS on your bed the night before your surgery  14. DO NOT SLEEP WITH PETS.   Day of Surgery: Wear Clean/Comfortable clothing the morning of surgery Do not apply any deodorants/lotions.   Remember to brush your teeth WITH YOUR REGULAR TOOTHPASTE.   Please read over the following fact sheets that you were given.

## 2020-12-10 ENCOUNTER — Encounter (HOSPITAL_COMMUNITY): Payer: Self-pay

## 2020-12-10 LAB — SARS CORONAVIRUS 2 (TAT 6-24 HRS): SARS Coronavirus 2: NEGATIVE

## 2020-12-10 NOTE — Anesthesia Preprocedure Evaluation (Addendum)
Anesthesia Evaluation  Patient identified by MRN, date of birth, ID band Patient awake    Reviewed: Allergy & Precautions, NPO status , Patient's Chart, lab work & pertinent test results, reviewed documented beta blocker date and time   History of Anesthesia Complications Negative for: history of anesthetic complications  Airway Mallampati: II  TM Distance: >3 FB Neck ROM: Full    Dental no notable dental hx.    Pulmonary asthma ,    Pulmonary exam normal        Cardiovascular hypertension, Pt. on medications and Pt. on home beta blockers +CHF  Normal cardiovascular exam  TTE 2017: EF 45-50%, diffuse hypokinesis, grade 1 DD, mild MR     Neuro/Psych Herniated disc Lumbar five-Sacral one Right negative psych ROS   GI/Hepatic Neg liver ROS, GERD  Controlled and Medicated,  Endo/Other  diabetes, Type 2, Oral Hypoglycemic Agents  Renal/GU negative Renal ROS  negative genitourinary   Musculoskeletal negative musculoskeletal ROS (+)   Abdominal   Peds  Hematology Polycythemia vera    Anesthesia Other Findings Day of surgery medications reviewed with patient.  Reproductive/Obstetrics negative OB ROS                            Anesthesia Physical Anesthesia Plan  ASA: II  Anesthesia Plan: General   Post-op Pain Management:    Induction: Intravenous  PONV Risk Score and Plan: 3 and Treatment may vary due to age or medical condition, Ondansetron, Dexamethasone and Midazolam  Airway Management Planned: Oral ETT  Additional Equipment:   Intra-op Plan:   Post-operative Plan: Extubation in OR  Informed Consent: I have reviewed the patients History and Physical, chart, labs and discussed the procedure including the risks, benefits and alternatives for the proposed anesthesia with the patient or authorized representative who has indicated his/her understanding and acceptance.     Dental  advisory given  Plan Discussed with: CRNA  Anesthesia Plan Comments: (PAT note by Karoline Caldwell, PA-C:  History of peripartum cardiomyopathy with subsequent normalization of EF.  Last echo 05/19/2017 showed normal left ventricular systolic function, calculated EF 51%, normal valves.  Follows with hematology at [facility] for history of polycythemia vera.  Last seen 01/10/2020.  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  EKG 12/09/2020: NSR.  Rate 70.  TTE 05/19/17 (care everywhere): INTERPRETATION ---------------------------------------------------------------  NORMAL LEFT VENTRICULAR SYSTOLIC FUNCTION  NORMAL LA PRESSURES WITH NORMAL DIASTOLIC FUNCTION  NORMAL RIGHT VENTRICULAR SYSTOLIC FUNCTION  VALVULAR REGURGITATION: TRIVIAL MR, TRIVIAL PR, TRIVIAL TR  NO VALVULAR STENOSIS  LVEF LOWER LIMITS OF NORMAL CALCULATED EF 51%   Stress test 02/16/17 (care everywhere): Conclusions:  1) The data suggests a maximal exercise test, so maximal cardiorespiratory  capacity could be determined.   2) Based on the peak oxygen consumption (VO2) achieved, functional capacity  would be consistent with no functional impairment, with peak VO2 18.3  ml/kg/min (87% of predicted normals). Normative peak predicted VO2 values  are corrected for age, gender, and body size.   3) There were no apparent signs of a circulatory, pulmonary, or mixed  limitation to exercise.  - TheO2 pulse (an indicator of stroke volume) reached a plateau at HR of  115 bpm.  - ECG notable for normal sinus rhythm with intermittent PVCs at rest and  during exercise.  - Ventilatory reserve limits were not approached at  peak exercise. Normal  pulmonary physiology at rest. The MVV is normal.   4) No prior CPET available for comparison.   )       Anesthesia Quick Evaluation

## 2020-12-10 NOTE — Progress Notes (Signed)
Anesthesia Chart Review:  History of peripartum cardiomyopathy with subsequent normalization of EF.  Last echo 05/19/2017 showed normal left ventricular systolic function, calculated EF 51%, normal valves.  Follows with hematology at [facility] for history of polycythemia vera.  Last seen 01/10/2020.  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  EKG 12/09/2020: NSR.  Rate 70.  TTE 05/19/17 (care everywhere): INTERPRETATION ---------------------------------------------------------------  NORMAL LEFT VENTRICULAR SYSTOLIC FUNCTION  NORMAL LA PRESSURES WITH NORMAL DIASTOLIC FUNCTION  NORMAL RIGHT VENTRICULAR SYSTOLIC FUNCTION  VALVULAR REGURGITATION: TRIVIAL MR, TRIVIAL PR, TRIVIAL TR  NO VALVULAR STENOSIS  LVEF LOWER LIMITS OF NORMAL CALCULATED EF 51%   Stress test 02/16/17 (care everywhere): Conclusions:  1) The data suggests a maximal exercise test, so maximal cardiorespiratory  capacity could be determined.   2) Based on the peak oxygen consumption (VO2) achieved, functional capacity  would be consistent with no functional impairment, with peak VO2 18.3  ml/kg/min (87% of predicted normals). Normative peak predicted VO2 values  are corrected for age, gender, and body size.   3) There were no apparent signs of a circulatory, pulmonary, or mixed  limitation to exercise.  - TheO2 pulse (an indicator of stroke volume) reached a plateau at HR of  115 bpm.  - ECG notable for normal sinus rhythm with intermittent PVCs at rest and  during exercise.  - Ventilatory reserve limits were not approached at peak exercise. Normal  pulmonary physiology at rest. The MVV is normal.   4) No prior CPET available for comparison.      Wynonia Musty [facility] Short Stay Center/Anesthesiology Phone [phone] 12/10/2020 4:22 PM

## 2020-12-12 ENCOUNTER — Ambulatory Visit (HOSPITAL_COMMUNITY): Payer: BC Managed Care – PPO | Admitting: Physician Assistant

## 2020-12-12 ENCOUNTER — Ambulatory Visit (HOSPITAL_COMMUNITY): Payer: BC Managed Care – PPO

## 2020-12-12 ENCOUNTER — Other Ambulatory Visit: Payer: Self-pay

## 2020-12-12 ENCOUNTER — Encounter (HOSPITAL_COMMUNITY): Payer: Self-pay | Admitting: Specialist

## 2020-12-12 ENCOUNTER — Ambulatory Visit (HOSPITAL_COMMUNITY): Payer: BC Managed Care – PPO | Admitting: Anesthesiology

## 2020-12-12 ENCOUNTER — Ambulatory Visit (HOSPITAL_COMMUNITY)
Admission: RE | Admit: 2020-12-12 | Discharge: 2020-12-13 | Disposition: A | Discharge status: Home / Self Care | Payer: BC Managed Care – PPO | Attending: Specialist | Admitting: Specialist

## 2020-12-12 ENCOUNTER — Ambulatory Visit: Payer: Self-pay | Admitting: Orthopedic Surgery

## 2020-12-12 ENCOUNTER — Encounter (HOSPITAL_COMMUNITY)
Admission: RE | Disposition: A | Discharge status: Home / Self Care | Payer: Self-pay | Source: Home / Self Care | Attending: Specialist

## 2020-12-12 DIAGNOSIS — Z8249 Family history of ischemic heart disease and other diseases of the circulatory system: Secondary | ICD-10-CM | POA: Insufficient documentation

## 2020-12-12 DIAGNOSIS — Z419 Encounter for procedure for purposes other than remedying health state, unspecified: Secondary | ICD-10-CM

## 2020-12-12 DIAGNOSIS — Z833 Family history of diabetes mellitus: Secondary | ICD-10-CM | POA: Insufficient documentation

## 2020-12-12 DIAGNOSIS — Z8589 Personal history of malignant neoplasm of other organs and systems: Secondary | ICD-10-CM | POA: Diagnosis not present

## 2020-12-12 DIAGNOSIS — D45 Polycythemia vera: Secondary | ICD-10-CM | POA: Diagnosis not present

## 2020-12-12 DIAGNOSIS — E7212 Methylenetetrahydrofolate reductase deficiency: Secondary | ICD-10-CM | POA: Insufficient documentation

## 2020-12-12 DIAGNOSIS — D473 Essential (hemorrhagic) thrombocythemia: Secondary | ICD-10-CM | POA: Diagnosis not present

## 2020-12-12 DIAGNOSIS — M4807 Spinal stenosis, lumbosacral region: Secondary | ICD-10-CM | POA: Insufficient documentation

## 2020-12-12 DIAGNOSIS — M5117 Intervertebral disc disorders with radiculopathy, lumbosacral region: Secondary | ICD-10-CM | POA: Diagnosis not present

## 2020-12-12 DIAGNOSIS — Z9221 Personal history of antineoplastic chemotherapy: Secondary | ICD-10-CM | POA: Diagnosis not present

## 2020-12-12 DIAGNOSIS — Z809 Family history of malignant neoplasm, unspecified: Secondary | ICD-10-CM | POA: Insufficient documentation

## 2020-12-12 DIAGNOSIS — Z91048 Other nonmedicinal substance allergy status: Secondary | ICD-10-CM | POA: Diagnosis not present

## 2020-12-12 DIAGNOSIS — M5126 Other intervertebral disc displacement, lumbar region: Secondary | ICD-10-CM

## 2020-12-12 DIAGNOSIS — Z8632 Personal history of gestational diabetes: Secondary | ICD-10-CM | POA: Insufficient documentation

## 2020-12-12 HISTORY — PX: LUMBAR LAMINECTOMY/DECOMPRESSION MICRODISCECTOMY: SHX5026

## 2020-12-12 LAB — POCT PREGNANCY, URINE: Preg Test, Ur: NEGATIVE

## 2020-12-12 LAB — GLUCOSE, CAPILLARY: Glucose-Capillary: 93 mg/dL (ref 70–99)

## 2020-12-12 LAB — HEMOGLOBIN A1C
Hgb A1c MFr Bld: 5.3 % (ref 4.8–5.6)
Mean Plasma Glucose: 105.41 mg/dL

## 2020-12-12 SURGERY — LUMBAR LAMINECTOMY/DECOMPRESSION MICRODISCECTOMY 1 LEVEL
Anesthesia: General | Laterality: Right

## 2020-12-12 MED ORDER — MENTHOL 3 MG MT LOZG: 1.0000 | LOZENGE | OROMUCOSAL | Status: DC | PRN

## 2020-12-12 MED ORDER — POTASSIUM CHLORIDE IN NACL 20-0.45 MEQ/L-% IV SOLN
INTRAVENOUS | Status: DC
  Filled 2020-12-12: qty 1000

## 2020-12-12 MED ORDER — SUGAMMADEX SODIUM 200 MG/2ML IV SOLN
INTRAVENOUS | Status: DC | PRN
  Administered 2020-12-12: 200 mg via INTRAVENOUS

## 2020-12-12 MED ORDER — ACETAMINOPHEN 325 MG PO TABS
650.0000 mg | ORAL_TABLET | ORAL | Status: DC | PRN
Start: 2020-12-12 — End: 2020-12-13
  Administered 2020-12-12 – 2020-12-13 (×2): 650 mg via ORAL
  Filled 2020-12-12 (×2): qty 2

## 2020-12-12 MED ORDER — METHOCARBAMOL 500 MG PO TABS
500.0000 mg | ORAL_TABLET | Freq: Four times a day (QID) | ORAL | Status: DC | PRN
  Administered 2020-12-12 – 2020-12-13 (×2): 500 mg via ORAL
  Filled 2020-12-12 (×2): qty 1

## 2020-12-12 MED ORDER — OXYCODONE HCL 5 MG PO TABS
10.0000 mg | ORAL_TABLET | ORAL | Status: DC | PRN
  Administered 2020-12-12 – 2020-12-13 (×5): 10 mg via ORAL
  Filled 2020-12-12 (×5): qty 2

## 2020-12-12 MED ORDER — ROCURONIUM BROMIDE 10 MG/ML (PF) SYRINGE
PREFILLED_SYRINGE | INTRAVENOUS | Status: AC
  Filled 2020-12-12: qty 10

## 2020-12-12 MED ORDER — DOCUSATE SODIUM 100 MG PO CAPS: 100.0000 mg | ORAL_CAPSULE | Freq: Two times a day (BID) | ORAL | 1 refills | Status: DC | PRN

## 2020-12-12 MED ORDER — ACETAMINOPHEN 10 MG/ML IV SOLN: 1000.0000 mg | INTRAVENOUS | Status: DC

## 2020-12-12 MED ORDER — FENTANYL CITRATE (PF) 100 MCG/2ML IJ SOLN
INTRAMUSCULAR | Status: AC
  Administered 2020-12-12: 50 ug via INTRAVENOUS
  Filled 2020-12-12: qty 2

## 2020-12-12 MED ORDER — DOCUSATE SODIUM 100 MG PO CAPS
100.0000 mg | ORAL_CAPSULE | Freq: Two times a day (BID) | ORAL | Status: DC
  Administered 2020-12-12 – 2020-12-13 (×2): 100 mg via ORAL
  Filled 2020-12-12 (×2): qty 1

## 2020-12-12 MED ORDER — FUROSEMIDE 20 MG PO TABS: 20.0000 mg | ORAL_TABLET | Freq: Every day | ORAL | Status: DC | PRN

## 2020-12-12 MED ORDER — ALUM & MAG HYDROXIDE-SIMETH 200-200-20 MG/5ML PO SUSP: 30.0000 mL | Freq: Four times a day (QID) | ORAL | Status: DC | PRN

## 2020-12-12 MED ORDER — LISINOPRIL 20 MG PO TABS
20.0000 mg | ORAL_TABLET | Freq: Every day | ORAL | Status: DC
  Administered 2020-12-12: 20 mg via ORAL
  Filled 2020-12-12: qty 1

## 2020-12-12 MED ORDER — OXYCODONE-ACETAMINOPHEN 10-325 MG PO TABS: 1.0000 | ORAL_TABLET | Freq: Four times a day (QID) | ORAL | 0 refills | Status: DC | PRN

## 2020-12-12 MED ORDER — POLYETHYLENE GLYCOL 3350 17 G PO PACK: 17.0000 g | PACK | Freq: Every day | ORAL | Status: DC | PRN

## 2020-12-12 MED ORDER — HYDROCHLOROTHIAZIDE 25 MG PO TABS
25.0000 mg | ORAL_TABLET | Freq: Every day | ORAL | Status: DC
  Administered 2020-12-12: 25 mg via ORAL
  Filled 2020-12-12: qty 1

## 2020-12-12 MED ORDER — OXYCODONE HCL 5 MG PO TABS
5.0000 mg | ORAL_TABLET | Freq: Once | ORAL | Status: DC | PRN
Start: 2020-12-12 — End: 2020-12-12

## 2020-12-12 MED ORDER — BISACODYL 5 MG PO TBEC: 5.0000 mg | DELAYED_RELEASE_TABLET | Freq: Every day | ORAL | Status: DC | PRN

## 2020-12-12 MED ORDER — OXYCODONE HCL 5 MG/5ML PO SOLN: 5.0000 mg | Freq: Once | ORAL | Status: DC | PRN

## 2020-12-12 MED ORDER — PROPOFOL 10 MG/ML IV BOLUS
INTRAVENOUS | Status: AC
  Filled 2020-12-12: qty 20

## 2020-12-12 MED ORDER — METHOCARBAMOL 500 MG PO TABS
500.0000 mg | ORAL_TABLET | Freq: Three times a day (TID) | ORAL | 1 refills | Status: AC | PRN
Start: 2020-12-12 — End: ?

## 2020-12-12 MED ORDER — DEXAMETHASONE SODIUM PHOSPHATE 10 MG/ML IJ SOLN
INTRAMUSCULAR | Status: DC | PRN
  Administered 2020-12-12: 4 mg via INTRAVENOUS

## 2020-12-12 MED ORDER — BUPIVACAINE-EPINEPHRINE (PF) 0.25% -1:200000 IJ SOLN
INTRAMUSCULAR | Status: DC | PRN
  Administered 2020-12-12: 2 mL via PERINEURAL

## 2020-12-12 MED ORDER — MIDAZOLAM HCL 2 MG/2ML IJ SOLN
INTRAMUSCULAR | Status: AC
  Filled 2020-12-12: qty 2

## 2020-12-12 MED ORDER — FENTANYL CITRATE (PF) 250 MCG/5ML IJ SOLN
INTRAMUSCULAR | Status: AC
  Filled 2020-12-12: qty 5

## 2020-12-12 MED ORDER — LIDOCAINE 2% (20 MG/ML) 5 ML SYRINGE
INTRAMUSCULAR | Status: AC
  Filled 2020-12-12: qty 5

## 2020-12-12 MED ORDER — THROMBIN 20000 UNITS EX SOLR
CUTANEOUS | Status: AC
  Filled 2020-12-12: qty 20000

## 2020-12-12 MED ORDER — LISINOPRIL-HYDROCHLOROTHIAZIDE 20-25 MG PO TABS: 1.0000 | ORAL_TABLET | Freq: Every day | ORAL | Status: DC

## 2020-12-12 MED ORDER — FENTANYL CITRATE (PF) 100 MCG/2ML IJ SOLN
25.0000 ug | INTRAMUSCULAR | Status: DC | PRN
  Administered 2020-12-12: 50 ug via INTRAVENOUS

## 2020-12-12 MED ORDER — ALBUTEROL SULFATE HFA 108 (90 BASE) MCG/ACT IN AERS: 2.0000 | INHALATION_SPRAY | RESPIRATORY_TRACT | Status: DC | PRN

## 2020-12-12 MED ORDER — ROCURONIUM BROMIDE 10 MG/ML (PF) SYRINGE
PREFILLED_SYRINGE | INTRAVENOUS | Status: DC | PRN
  Administered 2020-12-12: 60 mg via INTRAVENOUS

## 2020-12-12 MED ORDER — ONDANSETRON 4 MG PO TBDP: 4.0000 mg | ORAL_TABLET | Freq: Three times a day (TID) | ORAL | Status: DC | PRN

## 2020-12-12 MED ORDER — LACTATED RINGERS IV SOLN: INTRAVENOUS | Status: DC

## 2020-12-12 MED ORDER — 0.9 % SODIUM CHLORIDE (POUR BTL) OPTIME
TOPICAL | Status: DC | PRN
  Administered 2020-12-12: 1000 mL

## 2020-12-12 MED ORDER — ASCORBIC ACID 500 MG PO TABS
500.0000 mg | ORAL_TABLET | Freq: Two times a day (BID) | ORAL | Status: DC
  Administered 2020-12-13: 500 mg via ORAL
  Filled 2020-12-12 (×2): qty 1

## 2020-12-12 MED ORDER — OXYCODONE HCL 5 MG PO TABS: 5.0000 mg | ORAL_TABLET | ORAL | Status: DC | PRN

## 2020-12-12 MED ORDER — RISAQUAD PO CAPS
1.0000 | ORAL_CAPSULE | Freq: Every day | ORAL | Status: DC
  Administered 2020-12-12 – 2020-12-13 (×2): 1 via ORAL
  Filled 2020-12-12 (×2): qty 1

## 2020-12-12 MED ORDER — ONDANSETRON HCL 4 MG/2ML IJ SOLN
INTRAMUSCULAR | Status: DC | PRN
  Administered 2020-12-12: 4 mg via INTRAVENOUS

## 2020-12-12 MED ORDER — LIDOCAINE 2% (20 MG/ML) 5 ML SYRINGE
INTRAMUSCULAR | Status: DC | PRN
  Administered 2020-12-12: 100 mg via INTRAVENOUS

## 2020-12-12 MED ORDER — PHENOL 1.4 % MT LIQD: 1.0000 | OROMUCOSAL | Status: DC | PRN

## 2020-12-12 MED ORDER — METHOCARBAMOL 1000 MG/10ML IJ SOLN
500.0000 mg | Freq: Four times a day (QID) | INTRAVENOUS | Status: DC | PRN
  Filled 2020-12-12: qty 5

## 2020-12-12 MED ORDER — METOPROLOL SUCCINATE ER 50 MG PO TB24
50.0000 mg | ORAL_TABLET | Freq: Every day | ORAL | Status: DC
  Administered 2020-12-12: 50 mg via ORAL
  Filled 2020-12-12: qty 1

## 2020-12-12 MED ORDER — MAGNESIUM CITRATE PO SOLN: 1.0000 | Freq: Once | ORAL | Status: DC | PRN

## 2020-12-12 MED ORDER — ACETAMINOPHEN 10 MG/ML IV SOLN
INTRAVENOUS | Status: AC
  Filled 2020-12-12: qty 100

## 2020-12-12 MED ORDER — CEFAZOLIN SODIUM-DEXTROSE 2-4 GM/100ML-% IV SOLN
2.0000 g | Freq: Three times a day (TID) | INTRAVENOUS | Status: AC
Start: 2020-12-12 — End: 2020-12-13
  Administered 2020-12-12 – 2020-12-13 (×2): 2 g via INTRAVENOUS
  Filled 2020-12-12 (×2): qty 100

## 2020-12-12 MED ORDER — ALBUTEROL SULFATE HFA 108 (90 BASE) MCG/ACT IN AERS
INHALATION_SPRAY | RESPIRATORY_TRACT | Status: DC | PRN
  Administered 2020-12-12: 2 via RESPIRATORY_TRACT

## 2020-12-12 MED ORDER — DEXAMETHASONE SODIUM PHOSPHATE 10 MG/ML IJ SOLN
INTRAMUSCULAR | Status: AC
  Filled 2020-12-12: qty 1

## 2020-12-12 MED ORDER — ONDANSETRON HCL 4 MG/2ML IJ SOLN
INTRAMUSCULAR | Status: AC
  Filled 2020-12-12: qty 2

## 2020-12-12 MED ORDER — CHLORHEXIDINE GLUCONATE 0.12 % MT SOLN: 15.0000 mL | OROMUCOSAL | Status: AC

## 2020-12-12 MED ORDER — HYDROMORPHONE HCL 1 MG/ML IJ SOLN
0.5000 mg | INTRAMUSCULAR | Status: DC | PRN
  Administered 2020-12-12: 0.5 mg via INTRAVENOUS
  Filled 2020-12-12: qty 0.5

## 2020-12-12 MED ORDER — MIDAZOLAM HCL 5 MG/5ML IJ SOLN
INTRAMUSCULAR | Status: DC | PRN
  Administered 2020-12-12: 2 mg via INTRAVENOUS

## 2020-12-12 MED ORDER — CHLORHEXIDINE GLUCONATE 0.12 % MT SOLN
OROMUCOSAL | Status: AC
  Administered 2020-12-12: 15 mL via OROMUCOSAL
  Filled 2020-12-12: qty 15

## 2020-12-12 MED ORDER — ACETAMINOPHEN 10 MG/ML IV SOLN
INTRAVENOUS | Status: DC | PRN
  Administered 2020-12-12: 1000 mg via INTRAVENOUS

## 2020-12-12 MED ORDER — MELATONIN 5 MG PO TABS
10.0000 mg | ORAL_TABLET | Freq: Every day | ORAL | Status: DC
Start: 2020-12-12 — End: 2020-12-13
  Administered 2020-12-12: 10 mg via ORAL
  Filled 2020-12-12: qty 2

## 2020-12-12 MED ORDER — PROPOFOL 10 MG/ML IV BOLUS
INTRAVENOUS | Status: DC | PRN
  Administered 2020-12-12: 170 mg via INTRAVENOUS

## 2020-12-12 MED ORDER — PROMETHAZINE HCL 25 MG/ML IJ SOLN: 6.2500 mg | INTRAMUSCULAR | Status: DC | PRN

## 2020-12-12 MED ORDER — ONDANSETRON HCL 4 MG/2ML IJ SOLN: 4.0000 mg | Freq: Four times a day (QID) | INTRAMUSCULAR | Status: DC | PRN

## 2020-12-12 MED ORDER — FENTANYL CITRATE (PF) 250 MCG/5ML IJ SOLN
INTRAMUSCULAR | Status: DC | PRN
  Administered 2020-12-12: 150 ug via INTRAVENOUS
  Administered 2020-12-12 (×2): 25 ug via INTRAVENOUS
  Administered 2020-12-12: 50 ug via INTRAVENOUS

## 2020-12-12 MED ORDER — ACETAMINOPHEN 650 MG RE SUPP
650.0000 mg | RECTAL | Status: DC | PRN
Start: 2020-12-12 — End: 2020-12-13

## 2020-12-12 MED ORDER — THROMBIN 20000 UNITS EX SOLR
CUTANEOUS | Status: DC | PRN
  Administered 2020-12-12: 20 mL via TOPICAL

## 2020-12-12 MED ORDER — INSULIN ASPART 100 UNIT/ML ~~LOC~~ SOLN: 0.0000 [IU] | Freq: Three times a day (TID) | SUBCUTANEOUS | Status: DC

## 2020-12-12 MED ORDER — ADULT MULTIVITAMIN W/MINERALS CH
1.0000 | ORAL_TABLET | Freq: Every day | ORAL | Status: DC
  Administered 2020-12-13: 1 via ORAL
  Filled 2020-12-12: qty 1

## 2020-12-12 MED ORDER — ONDANSETRON HCL 4 MG PO TABS
4.0000 mg | ORAL_TABLET | Freq: Four times a day (QID) | ORAL | Status: DC | PRN
  Administered 2020-12-13 (×2): 4 mg via ORAL
  Filled 2020-12-12 (×2): qty 1

## 2020-12-12 MED ORDER — ALLOPURINOL 300 MG PO TABS
300.0000 mg | ORAL_TABLET | Freq: Every day | ORAL | Status: DC
  Administered 2020-12-12 – 2020-12-13 (×2): 300 mg via ORAL
  Filled 2020-12-12 (×2): qty 1

## 2020-12-12 MED ORDER — POLYETHYLENE GLYCOL 3350 17 G PO PACK: 17.0000 g | PACK | Freq: Every day | ORAL | 0 refills | Status: AC

## 2020-12-12 MED ORDER — CEFAZOLIN SODIUM-DEXTROSE 2-4 GM/100ML-% IV SOLN
2.0000 g | INTRAVENOUS | Status: AC
  Administered 2020-12-12: 2 g via INTRAVENOUS

## 2020-12-12 MED ORDER — CEFAZOLIN SODIUM-DEXTROSE 2-4 GM/100ML-% IV SOLN
INTRAVENOUS | Status: AC
  Filled 2020-12-12: qty 100

## 2020-12-12 SURGICAL SUPPLY — 55 items
BAG DECANTER FOR FLEXI CONT (MISCELLANEOUS) ×2 IMPLANT
BAND INSRT 18 STRL LF DISP RB (MISCELLANEOUS) ×2
BAND RUBBER #18 3X1/16 STRL (MISCELLANEOUS) ×4 IMPLANT
CLEANER TIP ELECTROSURG 2X2 (MISCELLANEOUS) ×2 IMPLANT
CNTNR URN SCR LID CUP LEK RST (MISCELLANEOUS) ×1 IMPLANT
CONT SPEC 4OZ STRL OR WHT (MISCELLANEOUS) ×2
COVER WAND RF STERILE (DRAPES) ×2 IMPLANT
DRAPE LAPAROTOMY 100X72X124 (DRAPES) ×2 IMPLANT
DRAPE MICROSCOPE LEICA (MISCELLANEOUS) ×2 IMPLANT
DRAPE SHEET LG 3/4 BI-LAMINATE (DRAPES) ×2 IMPLANT
DRAPE SURG 17X11 SM STRL (DRAPES) ×2 IMPLANT
DRAPE UTILITY XL STRL (DRAPES) ×2 IMPLANT
DRSG AQUACEL AG ADV 3.5X 4 (GAUZE/BANDAGES/DRESSINGS) IMPLANT
DRSG AQUACEL AG ADV 3.5X 6 (GAUZE/BANDAGES/DRESSINGS) IMPLANT
DRSG OPSITE POSTOP 4X6 (GAUZE/BANDAGES/DRESSINGS) ×1 IMPLANT
DRSG TELFA 3X8 NADH (GAUZE/BANDAGES/DRESSINGS) IMPLANT
DURAPREP 26ML APPLICATOR (WOUND CARE) ×2 IMPLANT
DURASEAL SPINE SEALANT 3ML (MISCELLANEOUS) IMPLANT
ELECT BLADE 4.0 EZ CLEAN MEGAD (MISCELLANEOUS)
ELECT REM PT RETURN 9FT ADLT (ELECTROSURGICAL) ×2
ELECTRODE BLDE 4.0 EZ CLN MEGD (MISCELLANEOUS) IMPLANT
ELECTRODE REM PT RTRN 9FT ADLT (ELECTROSURGICAL) ×1 IMPLANT
GLOVE SURG SS PI 7.5 STRL IVOR (GLOVE) ×2 IMPLANT
GLOVE SURG SS PI 8.0 STRL IVOR (GLOVE) ×4 IMPLANT
GLOVE SURG UNDER POLY LF SZ7 (GLOVE) ×2 IMPLANT
GOWN STRL REUS W/ TWL LRG LVL3 (GOWN DISPOSABLE) ×1 IMPLANT
GOWN STRL REUS W/ TWL XL LVL3 (GOWN DISPOSABLE) ×1 IMPLANT
GOWN STRL REUS W/TWL LRG LVL3 (GOWN DISPOSABLE) ×2
GOWN STRL REUS W/TWL XL LVL3 (GOWN DISPOSABLE) ×2
IV CATH 14GX2 1/4 (CATHETERS) ×2 IMPLANT
KIT BASIN OR (CUSTOM PROCEDURE TRAY) ×2 IMPLANT
NDL SPNL 18GX3.5 QUINCKE PK (NEEDLE) ×2 IMPLANT
NEEDLE 22X1 1/2 (OR ONLY) (NEEDLE) ×2 IMPLANT
NEEDLE SPNL 18GX3.5 QUINCKE PK (NEEDLE) ×4 IMPLANT
PACK LAMINECTOMY NEURO (CUSTOM PROCEDURE TRAY) ×2 IMPLANT
PAD DRESSING TELFA 3X8 NADH (GAUZE/BANDAGES/DRESSINGS) IMPLANT
PATTIES SURGICAL .75X.75 (GAUZE/BANDAGES/DRESSINGS) ×2 IMPLANT
SPONGE LAP 4X18 RFD (DISPOSABLE) IMPLANT
SPONGE SURGIFOAM ABS GEL 100 (HEMOSTASIS) ×2 IMPLANT
STAPLER VISISTAT (STAPLE) ×1 IMPLANT
STRIP CLOSURE SKIN 1/2X4 (GAUZE/BANDAGES/DRESSINGS) ×1 IMPLANT
SUT NURALON 4 0 TR CR/8 (SUTURE) IMPLANT
SUT PROLENE 3 0 PS 2 (SUTURE) IMPLANT
SUT VIC AB 1 CT1 27 (SUTURE)
SUT VIC AB 1 CT1 27XBRD ANTBC (SUTURE) IMPLANT
SUT VIC AB 1-0 CT2 27 (SUTURE) IMPLANT
SUT VIC AB 2-0 CT1 27 (SUTURE) ×2
SUT VIC AB 2-0 CT1 TAPERPNT 27 (SUTURE) IMPLANT
SUT VIC AB 2-0 CT2 27 (SUTURE) ×1 IMPLANT
SYR 3ML LL SCALE MARK (SYRINGE) ×2 IMPLANT
TOWEL GREEN STERILE (TOWEL DISPOSABLE) ×2 IMPLANT
TOWEL GREEN STERILE FF (TOWEL DISPOSABLE) ×2 IMPLANT
TRAY FOLEY MTR SLVR 14FR STAT (SET/KITS/TRAYS/PACK) ×1 IMPLANT
TRAY FOLEY MTR SLVR 16FR STAT (SET/KITS/TRAYS/PACK) ×1 IMPLANT
YANKAUER SUCT BULB TIP NO VENT (SUCTIONS) ×2 IMPLANT

## 2020-12-12 NOTE — H&P (Signed)
Leslie Velasquez is an 50 y.o. female.   Chief Complaint: back and right leg pain HPI: Reason for Visit: low back Context: The patient is years out from the onset of symptoms Location (Lower Extremity): lower back pain Severity: pain level 5/10; moderate Alleviating Factors: rest/sitting down/lying down; NSAIDS; injections; SNRB helped Notes: The patient is 5 weeks following right S1 SNRB states she felt much better after the nerve block but then developed a cough and symptoms flared back up  Past Medical History:  Diagnosis Date  . Acute CHF ([place]) 03/02/2016  . Asthma   . 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 Cancer ([place])    polycythemia vera cancer  . Essential thrombocythemia ([place]) 02/02/2016  . GERD (gastroesophageal reflux disease)   . Gestational diabetes 02/02/2016   glyburide  . Gestational diabetes mellitus (GDM), antepartum   . Hx of pyelonephritis   . Hypertension   . 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 Personal history of chemotherapy   . Polycythemia vera ([place]) 02/02/2016  . Pregnancy, supervision, high-risk 02/02/2016    Past Surgical History:  Procedure Laterality Date  . CESAREAN SECTION N/A 02/10/2016   Procedure: Primary CESAREAN SECTION;  Surgeon: Brien Few, MD;  Location: [facility];  Service: Obstetrics;  Laterality: N/A;  EDD: 03/02/16  . COLONOSCOPY    . DILATION AND EVACUATION N/A 01/07/2015   Procedure: DILATATION AND EVACUATION with Tissue Sent For Chromosome Analysis;  Surgeon: Brien Few, MD;  Location: [facility];  Service: Gynecology;  Laterality: N/A;  . MYOMECTOMY    . TONSILLECTOMY    . WISDOM TOOTH EXTRACTION      Family History  Problem Relation Age of Onset  . Diabetes Mother   . Hypertension Mother   . Hyperlipidemia Mother   . Cancer Father   . Diabetes Father   . Hypertension Father   . Hyperlipidemia Father   . Cancer Paternal Grandmother   . Cancer Paternal  Grandfather   . Alcohol abuse Neg Hx   . Arthritis Neg Hx   . Asthma Neg Hx   . Birth defects Neg Hx   . COPD Neg Hx   . Drug abuse Neg Hx   . Depression Neg Hx   . Early death Neg Hx   . Hearing loss Neg Hx   . Heart disease Neg Hx   . Kidney disease Neg Hx   . Learning disabilities Neg Hx   . Mental illness Neg Hx   . Mental retardation Neg Hx   . Miscarriages / Stillbirths Neg Hx   . Stroke Neg Hx   . Vision loss Neg Hx   . Varicose Veins Neg Hx    Social History:  reports that she has never smoked. She has never used smokeless tobacco. She reports that she does not drink alcohol and does not use drugs.  Allergies:  Allergies  Allergen Reactions  . Tape Rash and Other (See Comments)    Surgical tape - burning/blisters    (Not in a hospital admission)   No results found for this or any previous visit (from the past 48 hour(s)). No results found.  Review of Systems  Constitutional: Negative.   HENT: Negative.   Eyes: Negative.   Respiratory: Negative.   Cardiovascular: Negative.   Gastrointestinal: Negative.   Endocrine: Negative.   Genitourinary: Negative.   Musculoskeletal: Positive for back pain.  Skin: Negative.   Neurological: Positive for  weakness and numbness.  Psychiatric/Behavioral: Negative.     currently breastfeeding. Physical Exam Constitutional:      Appearance: Normal appearance.  HENT:     Head: Normocephalic.     Right Ear: External ear normal.     Left Ear: External ear normal.     Nose: Nose normal.     Mouth/Throat:     Mouth: Mucous membranes are moist.  Eyes:     Conjunctiva/sclera: Conjunctivae normal.  Cardiovascular:     Rate and Rhythm: Normal rate and regular rhythm.     Pulses: Normal pulses.     Heart sounds: Normal heart sounds.  Pulmonary:     Effort: Pulmonary effort is normal.  Abdominal:     General: Bowel sounds are normal.  Musculoskeletal:     Cervical back: Normal range of motion.     Comments: Patient is  a 50 year old female.  Gait and Station: Appearance: ambulating with no assistive devices and antalgic gait.  Constitutional: General Appearance: healthy-appearing and distress (mild).  Psychiatric: Mood and Affect: active and alert.  Cardiovascular System: Edema Right: none; Dorsalis and posterior tibial pulses 2+. Edema Left: none.  Abdomen: Inspection and Palpation: non-distended and no tenderness.  Skin: Inspection and palpation: no rash.  Lumbar Spine: Inspection: normal alignment. Bony Palpation of the Lumbar Spine: tender at lumbosacral junction.. Bony Palpation of the Right Hip: no tenderness of the greater trochanter and tenderness of the SI joint; Pelvis stable. Bony Palpation of the Left Hip: no tenderness of the greater trochanter and tenderness of the SI joint. Soft Tissue Palpation on the Right: No flank pain with percussion. Active Range of Motion: limited flexion and extention.  Motor Strength: L1 Motor Strength on the Right: hip flexion iliopsoas 5/5. L1 Motor Strength on the Left: hip flexion iliopsoas 5/5. L2-L4 Motor Strength on the Right: knee extension quadriceps 5/5. L2-L4 Motor Strength on the Left: knee extension quadriceps 5/5. L5 Motor Strength on the Right: ankle dorsiflexion tibialis anterior 5/5 and great toe extension extensor hallucis longus 5/5. L5 Motor Strength on the Left: ankle dorsiflexion tibialis anterior 5/5 and great toe extension extensor hallucis longus 5/5. S1 Motor Strength on the Right: plantar flexion gastrocnemius 5/5. S1 Motor Strength on the Left: plantar flexion gastrocnemius 5/5.  Neurological System: Knee Reflex Right: normal (2). Knee Reflex Left: normal (2). Ankle Reflex Right: normal (2). Ankle Reflex Left: normal (2). Babinski Reflex Right: plantar reflex absent. Babinski Reflex Left: plantar reflex absent. Sensation on the Right: normal distal extremities. Sensation on the Left: normal distal extremities. Special Tests on the Right: no  clonus of the ankle/knee and seated straight leg raising test positive. Special Tests on the Left: no clonus of the ankle/knee.  Skin:    General: Skin is warm and dry.  Neurological:     Mental Status: She is alert.    MRI demonstrates paracentral disc herniation L5-S1 to the right displacing the S1 nerve root. Disc degeneration L4-5 with lateral recess stenosis.  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 Had significant relief from an S1 nerve root block however though pain returned after coughing episode  Plan:  We discussed options.  She is asked whether a repeat S1 nerve root block would be an option I do think that is reasonable given that she had significant relief and an episode of coughing returned the pain  I discussed proceeding with a selective nerve root block injection at the level of the pathology and nerve root irritation. S1 on the right The Patient is in agreement. The procedure is performed under x-ray guidance. This is performed by Dr. Nelva Bush in our facility or with a Radiologist in town. This will be scheduled after authorization has been secured from your insurance company. Hopefully the injection will be therapeutic however every situation is different. If the relief is only temporarily effective we discussed a repeat injection versus consideration of a surgical procedure performed at that level. It is important to maintain a pain journal to recall the effects of the injection even if limited. If complete relief of symptoms are obtained then follow-up is optional. If only temporary relief of your symptoms are obtained then I would recommend a follow-up consultation for further discussion. Monitor your blood glucose levels and supplement accordingly following the injection if you are a diabetic.  If  it returns after that we discussed lumbar decompression  Plan microlumbar decompression L5-S1 right  Cecilie Kicks, PA-C for Dr. Tonita Cong 12/12/2020, 10:48 AM

## 2020-12-12 NOTE — Interval H&P Note (Signed)
History and Physical Interval Note:  12/12/2020 12:37 PM  Leslie Velasquez  has presented today for surgery, with the diagnosis of Herniated disc L5-S1 Right.  The various methods of treatment have been discussed with the patient and family. After consideration of risks, benefits and other options for treatment, the patient has consented to  Procedure(s) with comments: Microlumbar decompression, Microdiscectomy L5-S1 Right (Right) - 2 hrs as a surgical intervention.  The patient's history has been reviewed, patient examined, no change in status, stable for surgery.  I have reviewed the patient's chart and labs.  Questions were answered to the patient's satisfaction.   +SLR. 4/5 plantarflexion  Leslie Velasquez

## 2020-12-12 NOTE — Transfer of Care (Signed)
Immediate Anesthesia Transfer of Care Note  Patient: Leslie Velasquez  Procedure(s) Performed: Microlumbar decompression, Microdiscectomy Lumbar five-Sacral one Right (Right )  Patient Location: PACU  Anesthesia Type:General  Level of Consciousness: drowsy  Airway & Oxygen Therapy: Patient Spontanous Breathing  Post-op Assessment: Report given to RN and Post -op Vital signs reviewed and stable  Post vital signs: Reviewed and stable  Last Vitals:  Vitals Value Taken Time  BP 123/53 12/12/20 1509  Temp 36.4 C 12/12/20 1509  Pulse 119 12/12/20 1512  Resp 20 12/12/20 1512  SpO2 97 % 12/12/20 1512  Vitals shown include unvalidated device data.  Last Pain:  Vitals:   12/12/20 1509  TempSrc:   PainSc: 0-No pain      Patients Stated Pain Goal: 2 (12/29/20 4825)  Complications: No complications documented.

## 2020-12-12 NOTE — Anesthesia Procedure Notes (Signed)

## 2020-12-12 NOTE — Brief Op Note (Signed)
12/12/2020  2:50 PM  PATIENT:  Leslie Velasquez  50 y.o. female  PRE-OPERATIVE DIAGNOSIS:  Herniated disc Lumbar five-Sacral one Right  POST-OPERATIVE DIAGNOSIS:  Herniated disc Lumbar five-Sacral one Right  PROCEDURE:  Procedure(s): Microlumbar decompression, Microdiscectomy Lumbar five-Sacral one Right (Right)  SURGEON:  Surgeon(s) and Role:    Susa Day, MD - Primary  PHYSICIAN ASSISTANT:   ASSISTANTS: [place]   ANESTHESIA:   general  EBL:  25 mL   BLOOD ADMINISTERED:none  DRAINS: none   LOCAL MEDICATIONS USED:  MARCAINE     SPECIMEN:  No Specimen  DISPOSITION OF SPECIMEN:  N/A  COUNTS:  YES  TOURNIQUET:  * No tourniquets in log *  DICTATION: .Other Dictation: Dictation Number [id number]  PLAN OF CARE: Admit for overnight observation  PATIENT DISPOSITION:  PACU - hemodynamically stable.   Delay start of Pharmacological VTE agent (>24hrs) due to surgical blood loss or risk of bleeding: yes

## 2020-12-12 NOTE — Discharge Instructions (Signed)

## 2020-12-12 NOTE — H&P (View-Only) (Signed)
Leslie Velasquez is an 50 y.o. female.   Chief Complaint: back and right leg pain HPI: Reason for Visit: low back Context: The patient is years out from the onset of symptoms Location (Lower Extremity): lower back pain Severity: pain level 5/10; moderate Alleviating Factors: rest/sitting down/lying down; NSAIDS; injections; SNRB helped Notes: The patient is 5 weeks following right S1 SNRB states she felt much better after the nerve block but then developed a cough and symptoms flared back up  Past Medical History:  Diagnosis Date  . Acute CHF ([place]) 03/02/2016  . Asthma   . 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 Cancer ([place])    polycythemia vera cancer  . Essential thrombocythemia ([place]) 02/02/2016  . GERD (gastroesophageal reflux disease)   . Gestational diabetes 02/02/2016   glyburide  . Gestational diabetes mellitus (GDM), antepartum   . Hx of pyelonephritis   . Hypertension   . 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 Personal history of chemotherapy   . Polycythemia vera ([place]) 02/02/2016  . Pregnancy, supervision, high-risk 02/02/2016    Past Surgical History:  Procedure Laterality Date  . CESAREAN SECTION N/A 02/10/2016   Procedure: Primary CESAREAN SECTION;  Surgeon: Brien Few, MD;  Location: [facility];  Service: Obstetrics;  Laterality: N/A;  EDD: 03/02/16  . COLONOSCOPY    . DILATION AND EVACUATION N/A 01/07/2015   Procedure: DILATATION AND EVACUATION with Tissue Sent For Chromosome Analysis;  Surgeon: Brien Few, MD;  Location: [facility];  Service: Gynecology;  Laterality: N/A;  . MYOMECTOMY    . TONSILLECTOMY    . WISDOM TOOTH EXTRACTION      Family History  Problem Relation Age of Onset  . Diabetes Mother   . Hypertension Mother   . Hyperlipidemia Mother   . Cancer Father   . Diabetes Father   . Hypertension Father   . Hyperlipidemia Father   . Cancer Paternal Grandmother   . Cancer Paternal  Grandfather   . Alcohol abuse Neg Hx   . Arthritis Neg Hx   . Asthma Neg Hx   . Birth defects Neg Hx   . COPD Neg Hx   . Drug abuse Neg Hx   . Depression Neg Hx   . Early death Neg Hx   . Hearing loss Neg Hx   . Heart disease Neg Hx   . Kidney disease Neg Hx   . Learning disabilities Neg Hx   . Mental illness Neg Hx   . Mental retardation Neg Hx   . Miscarriages / Stillbirths Neg Hx   . Stroke Neg Hx   . Vision loss Neg Hx   . Varicose Veins Neg Hx    Social History:  reports that she has never smoked. She has never used smokeless tobacco. She reports that she does not drink alcohol and does not use drugs.  Allergies:  Allergies  Allergen Reactions  . Tape Rash and Other (See Comments)    Surgical tape - burning/blisters    (Not in a hospital admission)   No results found for this or any previous visit (from the past 48 hour(s)). No results found.  Review of Systems  Constitutional: Negative.   HENT: Negative.   Eyes: Negative.   Respiratory: Negative.   Cardiovascular: Negative.   Gastrointestinal: Negative.   Endocrine: Negative.   Genitourinary: Negative.   Musculoskeletal: Positive for back pain.  Skin: Negative.   Neurological: Positive for  weakness and numbness.  Psychiatric/Behavioral: Negative.     currently breastfeeding. Physical Exam Constitutional:      Appearance: Normal appearance.  HENT:     Head: Normocephalic.     Right Ear: External ear normal.     Left Ear: External ear normal.     Nose: Nose normal.     Mouth/Throat:     Mouth: Mucous membranes are moist.  Eyes:     Conjunctiva/sclera: Conjunctivae normal.  Cardiovascular:     Rate and Rhythm: Normal rate and regular rhythm.     Pulses: Normal pulses.     Heart sounds: Normal heart sounds.  Pulmonary:     Effort: Pulmonary effort is normal.  Abdominal:     General: Bowel sounds are normal.  Musculoskeletal:     Cervical back: Normal range of motion.     Comments: Patient is  a 50 year old female.  Gait and Station: Appearance: ambulating with no assistive devices and antalgic gait.  Constitutional: General Appearance: healthy-appearing and distress (mild).  Psychiatric: Mood and Affect: active and alert.  Cardiovascular System: Edema Right: none; Dorsalis and posterior tibial pulses 2+. Edema Left: none.  Abdomen: Inspection and Palpation: non-distended and no tenderness.  Skin: Inspection and palpation: no rash.  Lumbar Spine: Inspection: normal alignment. Bony Palpation of the Lumbar Spine: tender at lumbosacral junction.. Bony Palpation of the Right Hip: no tenderness of the greater trochanter and tenderness of the SI joint; Pelvis stable. Bony Palpation of the Left Hip: no tenderness of the greater trochanter and tenderness of the SI joint. Soft Tissue Palpation on the Right: No flank pain with percussion. Active Range of Motion: limited flexion and extention.  Motor Strength: L1 Motor Strength on the Right: hip flexion iliopsoas 5/5. L1 Motor Strength on the Left: hip flexion iliopsoas 5/5. L2-L4 Motor Strength on the Right: knee extension quadriceps 5/5. L2-L4 Motor Strength on the Left: knee extension quadriceps 5/5. L5 Motor Strength on the Right: ankle dorsiflexion tibialis anterior 5/5 and great toe extension extensor hallucis longus 5/5. L5 Motor Strength on the Left: ankle dorsiflexion tibialis anterior 5/5 and great toe extension extensor hallucis longus 5/5. S1 Motor Strength on the Right: plantar flexion gastrocnemius 5/5. S1 Motor Strength on the Left: plantar flexion gastrocnemius 5/5.  Neurological System: Knee Reflex Right: normal (2). Knee Reflex Left: normal (2). Ankle Reflex Right: normal (2). Ankle Reflex Left: normal (2). Babinski Reflex Right: plantar reflex absent. Babinski Reflex Left: plantar reflex absent. Sensation on the Right: normal distal extremities. Sensation on the Left: normal distal extremities. Special Tests on the Right: no  clonus of the ankle/knee and seated straight leg raising test positive. Special Tests on the Left: no clonus of the ankle/knee.  Skin:    General: Skin is warm and dry.  Neurological:     Mental Status: She is alert.    MRI demonstrates paracentral disc herniation L5-S1 to the right displacing the S1 nerve root. Disc degeneration L4-5 with lateral recess stenosis.  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 Had significant relief from an S1 nerve root block however though pain returned after coughing episode  Plan:  We discussed options.  She is asked whether a repeat S1 nerve root block would be an option I do think that is reasonable given that she had significant relief and an episode of coughing returned the pain  I discussed proceeding with a selective nerve root block injection at the level of the pathology and nerve root irritation. S1 on the right The Patient is in agreement. The procedure is performed under x-ray guidance. This is performed by Dr. Nelva Bush in our facility or with a Radiologist in town. This will be scheduled after authorization has been secured from your insurance company. Hopefully the injection will be therapeutic however every situation is different. If the relief is only temporarily effective we discussed a repeat injection versus consideration of a surgical procedure performed at that level. It is important to maintain a pain journal to recall the effects of the injection even if limited. If complete relief of symptoms are obtained then follow-up is optional. If only temporary relief of your symptoms are obtained then I would recommend a follow-up consultation for further discussion. Monitor your blood glucose levels and supplement accordingly following the injection if you are a diabetic.  If  it returns after that we discussed lumbar decompression  Plan microlumbar decompression L5-S1 right  Cecilie Kicks, PA-C for Dr. Tonita Cong 12/12/2020, 10:48 AM

## 2020-12-12 NOTE — Anesthesia Postprocedure Evaluation (Signed)
Anesthesia Post Note  Patient: Leslie Velasquez  Procedure(s) Performed: Microlumbar decompression, Microdiscectomy Lumbar five-Sacral one Right (Right )     Patient location during evaluation: PACU Anesthesia Type: General Level of consciousness: awake and alert and oriented Pain management: pain level controlled Vital Signs Assessment: post-procedure vital signs reviewed and stable Respiratory status: spontaneous breathing, nonlabored ventilation and respiratory function stable Cardiovascular status: blood pressure returned to baseline Postop Assessment: no apparent nausea or vomiting Anesthetic complications: no   No complications documented.  Last Vitals:  Vitals:   12/12/20 1524 12/12/20 1539  BP: 132/61 123/60  Pulse: (!) 107 99  Resp: (!) 29 17  Temp:    SpO2: 98% 95%    Last Pain:  Vitals:   12/12/20 1542  TempSrc:   PainSc: [place]

## 2020-12-13 ENCOUNTER — Encounter (HOSPITAL_COMMUNITY): Payer: Self-pay | Admitting: Specialist

## 2020-12-13 DIAGNOSIS — M5117 Intervertebral disc disorders with radiculopathy, lumbosacral region: Secondary | ICD-10-CM | POA: Diagnosis not present

## 2020-12-13 LAB — COMPREHENSIVE METABOLIC PANEL
ALT: 15 U/L (ref 0–44)
AST: 18 U/L (ref 15–41)
Albumin: 3.1 g/dL — ABNORMAL LOW (ref 3.5–5.0)
Alkaline Phosphatase: 49 U/L (ref 38–126)
Anion gap: 8 (ref 5–15)
BUN: 8 mg/dL (ref 6–20)
CO2: 23 mmol/L (ref 22–32)
Calcium: 8.4 mg/dL — ABNORMAL LOW (ref 8.9–10.3)
Chloride: 107 mmol/L (ref 98–111)
Creatinine, Ser: 0.85 mg/dL (ref 0.44–1.00)
GFR, Estimated: >60 mL/min (ref >60)
Glucose, Bld: 101 mg/dL — ABNORMAL HIGH (ref 70–99)
Potassium: 4 mmol/L (ref 3.5–5.1)
Sodium: 138 mmol/L (ref 135–145)
Total Bilirubin: 0.4 mg/dL (ref 0.3–1.2)
Total Protein: 5.4 g/dL — ABNORMAL LOW (ref 6.5–8.1)

## 2020-12-13 LAB — CBC
HCT: 35.1 % — ABNORMAL LOW (ref 36.0–46.0)
Hemoglobin: 11.8 g/dL — ABNORMAL LOW (ref 12.0–15.0)
MCH: 33 pg (ref 26.0–34.0)
MCHC: 33.6 g/dL (ref 30.0–36.0)
MCV: 98 fL (ref 80.0–100.0)
Platelets: 419 10*3/uL — ABNORMAL HIGH (ref 150–400)
RBC: 3.58 MIL/uL — ABNORMAL LOW (ref 3.87–5.11)
RDW: 13.6 % (ref 11.5–15.5)
WBC: 10.4 10*3/uL (ref 4.0–10.5)
nRBC: 0 % (ref 0.0–0.2)

## 2020-12-13 MED ORDER — SERTRALINE HCL 50 MG PO TABS
50.0000 mg | ORAL_TABLET | Freq: Every day | ORAL | Status: DC
  Administered 2020-12-13: 50 mg via ORAL
  Filled 2020-12-13: qty 1

## 2020-12-13 NOTE — Progress Notes (Signed)
Occupational Therapy Evaluation Patient Details Name: Leslie Velasquez MRN: [id number] DOB: 1971-04-05 Today's Date: 12/13/2020    History of Present Illness 50 yo s/p Microlumbar decompression, Microdiscectomy Lumbar five-Sacral one Right.See H& P for PMH.   Clinical Impression   Completed education regarding compensatory strategies and use of AE to increase independence with ADL and functional mobility for ADL. Pt able to return demonstrateg techniques when getting dressed. Pt independent with mobility. Pt will have support of husband after DC. Written information reviewed. No further OT services needed.     Follow Up Recommendations  No OT follow up;Supervision - Intermittent    Equipment Recommendations  None recommended by OT    Recommendations for Other Services       Precautions / Restrictions Precautions Precautions: Back      Mobility Bed Mobility Overal bed mobility: Needs Assistance             General bed mobility comments: Educated pt on log rolling technique adn proper positioning for sit <> supine. Pt able to return demonstrate    Transfers Overall transfer level: Independent                    Balance Overall balance assessment: No apparent balance deficits (not formally assessed)                                         ADL either performed or assessed with clinical judgement   ADL Overall ADL's : Needs assistance/impaired                         Toilet Transfer: Modified Independent           Functional mobility during ADLs: Independent General ADL Comments: Able to complete figure four positioning for LB ADL. Educated pt on compensatory strategies for LB bathing/dressing and use of AE if needed. If pt unable to reach bottom for pericare - demosntrated use of toilet tongs., which can also be sued for bathing if needed. Recommended pt purchase reacher if needed for IADL tasks/retrieving items from floor.      Vision         Perception     Praxis      Pertinent Vitals/Pain Pain Assessment: Faces Faces Pain Scale: Hurts a little bit Pain Location: back Pain Descriptors / Indicators: Discomfort Pain Intervention(s): Limited activity within patient's tolerance     Hand Dominance Right   Extremity/Trunk Assessment Upper Extremity Assessment Upper Extremity Assessment: Overall WFL for tasks assessed   Lower Extremity Assessment Lower Extremity Assessment: Overall WFL for tasks assessed   Cervical / Trunk Assessment Cervical / Trunk Assessment: Normal;Other exceptions (back sx)   Communication Communication Communication: No difficulties   Cognition Arousal/Alertness: Awake/alert Behavior During Therapy: WFL for tasks assessed/performed Overall Cognitive Status: Within Functional Limits for tasks assessed                                     General Comments  Educated on positioning in side lying and supine. Need to walk frequently throughout the day adn sit no longer than 45 min-hr at a time. Pt verbalized understanding.    Exercises     Shoulder Instructions      Home Living Family/patient expects to be discharged to:: Private residence  Living Arrangements: Spouse/significant other;Children Available Help at Discharge: Available 24 hours/day Type of Home: House Home Access: Stairs to enter [employer] of Steps: 4 Entrance Stairs-Rails: Right Home Layout: Two level;Able to live on main level with bedroom/bathroom     Bathroom Shower/Tub: Occupational psychologist: Standard Bathroom Accessibility: Yes How Accessible: Accessible via walker Home Equipment: None          Prior Functioning/Environment Level of Independence: Independent                 OT Problem List: Pain;Decreased knowledge of use of DME or AE;Decreased knowledge of precautions      OT Treatment/Interventions:      OT Goals(Current goals can be  found in the care plan section) Acute Rehab OT Goals Patient Stated Goal: to not mess her back up OT Goal Formulation: All assessment and education complete, DC therapy  OT Frequency:     Barriers to D/C:            Co-evaluation              AM-PAC OT "6 Clicks" Daily Activity     Outcome Measure Help from another person eating meals?: None Help from another person taking care of personal grooming?: None Help from another person toileting, which includes using toliet, bedpan, or urinal?: None Help from another person bathing (including washing, rinsing, drying)?: None Help from another person to put on and taking off regular upper body clothing?: None Help from another person to put on and taking off regular lower body clothing?: None 6 Click Score: 24   End of Session Nurse Communication: Other (comment) (DC plan)  Activity Tolerance: Patient tolerated treatment well Patient left: in bed;with call bell/phone within reach  OT Visit Diagnosis: Other abnormalities of gait and mobility (R26.89);Pain Pain - part of body:  (back)                Time: 5597-4163 OT Time Calculation (min): 25 min Charges:  OT General Charges $OT Visit: 1 Visit OT Evaluation $OT Eval Low Complexity: [place], OT/L   Acute OT Clinical Specialist Acute Rehabilitation Services Pager [phone] Office [phone]   [facility] 12/13/2020, 8:33 AM

## 2020-12-13 NOTE — Progress Notes (Signed)
PT Cancellation Note  Patient Details Name: Leslie Velasquez MRN: [id number] DOB: [birth year]   Cancelled Treatment:    Reason Eval/Treat Not Completed: PT screened, no needs identified, will sign off Per OT, patient overall independent with mobility and educated on back precautions. No PT needs required. PT will sign off.   Jammal Sarr A. Gilford Rile PT, DPT Acute Rehabilitation Services Pager [phone] Office [phone]    Linna Hoff 12/13/2020, 9:33 AM

## 2020-12-13 NOTE — Op Note (Signed)
[name], MAZER MEDICAL RECORD NO: [id number] ACCOUNT NO: [id number] DATE OF BIRTH: April 22, 1971 FACILITY: [facility] LOCATION: [facility] PHYSICIAN: [name], MD  Operative Report   DATE OF PROCEDURE: 12/12/2020  PREOPERATIVE DIAGNOSES:  Spinal stenosis, herniated nucleus pulposus L5-S1 right.  POSTOPERATIVE DIAGNOSES:  Spinal stenosis, herniated nucleus pulposus L5-S1 right.  PROCEDURE PERFORMED: 1.  Microlumbar decompression, L5-S1, right. 2.  Foraminotomy, S1 right. 3.  Microdiskectomy, L5-S1.  ANESTHESIA:  General.  ASSISTANT: Lacie Draft, PA  HISTORY:  A 50 year old female with right lower extremity radicular pain, neural tension signs, diminished plantar flexion. MRI indicating large extruded fragment at L5-S1, compressing the S1 nerve root.  She was indicated for microlumbar decompression  of L5-S1.  Risks and benefits were discussed including bleeding, infection, damage to neurovascular structures, no change in symptoms, worsening symptoms, DVT, PE, anesthetic complications, etc.  DESCRIPTION OF PROCEDURE:  With the patient in supine position, after induction of adequate general anesthesia, 2 grams Kefzol, she was placed prone on the Wilson frame.  All bony prominences were well padded.  Lumbar region was prepped and draped in the  usual sterile fashion.  Two 18-gauge spinal needles utilized to localize 5-1 interspace, confirmed with x-ray.  Incision was made from the spinous process of L5-S1.  Subcutaneous tissue was dissected.  Electrocautery was utilized to achieve hemostasis.   0.25% Marcaine with epinephrine was infiltrated in subcutaneous tissue.  Dorsal lumbar fascia identified and divided in line with skin incision.  Paraspinous muscle elevated from the lamina of L5 and S1.  McCulloch retractor was placed.  The operating  microscope was draped, brought in the surgical field.  Confirmatory radiograph obtained.  Micro curette was utilized to detach ligamentum flavum  from the cephalad edge of S1.  Hemilaminotomy of the caudad edge of L5 was performed with a 2 mm Kerrison to  the point detaching the ligamentum flavum.  A hemilaminotomy of the cephalad edge of S1 was performed.  A foraminotomy of S1 was performed.  Ligamentum flavum removed from the interspace.  Epidural fat was noted.  The S1 nerve root was identified,  displaced dorsally.  I gently mobilized the S1 nerve root medially.  I decompressed the lateral recess to the medial border of the pedicle.  There was a vascular leash tethering the S1 nerve root.  This was cauterized and divided.  I identified a large  disk herniation.  Neuro patties were used to protect the neural elements.  I performed annulotomy and copious portions of disk material were removed from this extruded herniation and from the disk space with multiple passes with a micropituitary, further  mobilizing with an Epstein, preserving the endplates.  Additional fragments were retrieved.  This was irrigated with catheter lavage and fragments were removed following this. After all removal of herniated material was performed, I obtained a  confirmatory radiograph.  Irrigated the disk space.  A Woodson probe passed freely out the foramen of S1, out the foramen of L5 and above the pedicle of L5.  There is 1 cm of excursion of the S1 nerve root medial to the pedicle without tension.  No  evidence of CSF leakage or active bleeding.  We placed bone wax on the cancellous surfaces.  Very small 2 x 2 mm thrombin-soaked Gelfoam was placed in the laminotomy defect.  I had checked the shoulder of the root,  the axilla of the root, both foramen  and beneath the thecal sac.  No evidence of residual disk herniation noted.  I  then removed the [facility] retractor.  Paraspinous muscles irrigated.  No active bleeding.  The dorsal lumbar fascia reapproximated with 1 Vicryl in interrupted  figure-of-eight sutures, subcutaneous with 2-0 and skin with staples.  The wounds  dressed sterilely.  The patient was placed supine on the hospital bed, extubated without difficulty and transported to the recovery room in satisfactory condition.  The patient tolerated the procedure well.  No complications.  Assistant, Lacie Draft, [place].  Minimal blood loss.   SHW D: 12/12/2020 2:56:30 pm T: 12/13/2020 4:39:00 am  JOB: [id number]/ [id number]

## 2020-12-13 NOTE — Progress Notes (Signed)
Patient discharged home per order. Patient alert and oriented, voiding adequately, with no s/s of infection and bruising noted on incision. Patient requested for extra dressing for dressing change at home. Patient has all belonging at her side at discharged

## 2020-12-13 NOTE — Progress Notes (Signed)
° ° °  Subjective: Procedure(s) (LRB): Microlumbar decompression, Microdiscectomy Lumbar five-Sacral one Right (Right) 1 Day Post-Op  Patient reports pain as 1 on 0-10 scale.  Reports decreased leg pain reports incisional back pain   Positive void Positive bowel movement Positive flatus Negative chest pain or shortness of breath  Objective: Vital signs in last 24 hours: Temp:  [97.6 F (36.4 C)-99 F (37.2 C)] 97.9 F (36.6 C) (03/05 0339) Pulse Rate:  [66-123] 66 (03/05 0339) Resp:  [17-29] 20 (03/05 0339) BP: (106-132)/(53-74) 106/60 (03/05 0339) SpO2:  [95 %-100 %] 98 % (03/05 0339) Weight:  [90.7 kg] 90.7 kg (03/04 1119)  Intake/Output from previous day: 03/04 0701 - 03/05 0700 In: 1020 [P.O.:120; I.V.:800; IV Piggyback:100] Out: 625 [Urine:600; Blood:25]  Labs: Recent Labs    12/13/20 0555  WBC 10.4  RBC 3.58*  HCT 35.1*  PLT 419*   Recent Labs    12/13/20 0555  NA 138  K 4.0  CL 107  CO2 23  BUN 8  CREATININE 0.85  GLUCOSE 101*  CALCIUM 8.4*   No results for input(s): LABPT, INR in the last 72 hours.  Physical Exam: Neurologically intact ABD soft Sensation intact distally Dorsiflexion/Plantar flexion intact Incision: dressing C/D/I and no drainage Compartment soft Body mass index is 32.28 kg/m.   Assessment/Plan: Patient stable  xrays n/a Continue mobilization with physical therapy Continue care  Advance diet Up with therapy  Doing well - plan on d/c to home today  Melina Schools, MD [facility] [phone]

## 2021-12-04 ENCOUNTER — Encounter: Payer: Self-pay | Admitting: Gastroenterology

## 2021-12-31 ENCOUNTER — Encounter: Payer: Self-pay | Admitting: Gastroenterology

## 2021-12-31 ENCOUNTER — Ambulatory Visit (INDEPENDENT_AMBULATORY_CARE_PROVIDER_SITE_OTHER): Payer: BC Managed Care – PPO | Admitting: Gastroenterology

## 2021-12-31 ENCOUNTER — Other Ambulatory Visit: Payer: Self-pay

## 2021-12-31 VITALS — BP 122/8 | HR 82 | Ht 65.0 in | Wt 186.5 lb

## 2021-12-31 DIAGNOSIS — K5900 Constipation, unspecified: Secondary | ICD-10-CM | POA: Diagnosis not present

## 2021-12-31 DIAGNOSIS — K625 Hemorrhage of anus and rectum: Secondary | ICD-10-CM | POA: Diagnosis not present

## 2021-12-31 MED ORDER — CLENPIQ 10-3.5-12 MG-GM -GM/160ML PO SOLN: 1.0000 | Freq: Once | ORAL | 0 refills | Status: AC

## 2021-12-31 MED ORDER — HYDROCORTISONE (PERIANAL) 2.5 % EX CREA: 1.0000 "application " | TOPICAL_CREAM | Freq: Two times a day (BID) | CUTANEOUS | 2 refills | Status: DC

## 2021-12-31 NOTE — Patient Instructions (Addendum)
If you are age 49 or older, your body mass index should be between 23-30. Your Body mass index is 31.04 kg/m?Marland Kitchen If this is out of the aforementioned range listed, please consider follow up with your Primary Care Provider. ? ?If you are age 42 or younger, your body mass index should be between 19-25. Your Body mass index is 31.04 kg/m?Marland Kitchen If this is out of the aformentioned range listed, please consider follow up with your Primary Care Provider.  ? ?________________________________________________________ ? ?The [facility] providers would like to encourage you to use [facility] to communicate with providers for non-urgent requests or questions.  Due to long hold times on the telephone, sending your provider a message by [facility] may be a faster and more efficient way to get a response.  Please allow 48 business hours for a response.  Please remember that this is for non-urgent requests.  ?_______________________________________________________ ? ?Two days before your procedure: Mix 3 packs (or capfuls) of Miralax in 48 ounces of clear liquid and drink at 6pm. ? ?You have been scheduled for a colonoscopy. Please follow written instructions given to you at your visit today.  ?Please pick up your prep supplies at the pharmacy within the next 1-3 days. ?If you use inhalers (even only as needed), please bring them with you on the day of your procedure. ? ?Please purchase the following medications over the counter and take as directed: ?Miralax 17g daily in [place] water ? ?We have sent the following medications to your pharmacy for you to pick up at your convenience: ?Anusol Cream  ?Clenpiq ? ?Please call with any question or concerns. ? ?Thank you, ? ?Dr. Jackquline Denmark ? ? ? ? ? ? ?We want to thank you for trusting [facility] with your care. All of our staff and providers value the relationships we have built with our patients, and it is an honor to care for you.  ? ?We are writing to let you know that  [facility] [place] [facility] will close on Feb 22, 2022, and we invite you to continue to see Dr. Carmell Austria and Gerrit Heck at the [facility] [place] office location. We are consolidating our serices at these [facility] practices to better provide care. Our office staff will work with you to ensure a seamless transition.  ? ?Gerrit Heck, DO -Dr. Bryan Lemma will be movig to [facility] at [address], [place], [place] [address], effective Feb 22, 2022.  Contact [phone] to schedule an appointment with him.  ? ?Carmell Austria, MD- Dr. Lyndel Safe will be movig to [facility] at [address] [place],  [address], effective Feb 22, 2022.  Contact [phone] to schedule an appointment with him.  ? ?Requesting Medical Records ?If you need to request your medical records, please follow the instructions below. Your medical records are confidential, and a copy can be transferred to another provider or released to you or another person you designate only with your permission. ? ?There are several ways to request your medical records: ?Requests for medical records can be submitted through our practice.   ?You can also request your records electronically, in your MyChart account by selecting the ?Request Health Records? tab.  ?If you need additional information on how to request records, please go to [email or web address]/, choose Patient Information, then select Request Medical Records. ?To make an appointment or if you have any questions about your health care needs, please contact our office at [phone] and one of  our staff members will be glad to assist you. ?[place] is committed to providing exceptional care for you and our community. Thank you for allowing [place] to serve your health care needs. ?Sincerely, ? ?Windy Canny, Director [facility] ?[place] also offers convenient virtual care options. Sore throat? Sinus problems? Cold or flu  symptoms? Get care from the comfort of home with [facility] Video Visits and e-Visits. Learn more about the non-emergency conditions treated and start your virtual visit at [email or web address] ? ?

## 2021-12-31 NOTE — Progress Notes (Signed)
? ? ?Chief Complaint:  ? ?Referring Provider:  Zoila Shutter, NP    ? ? ?ASSESSMENT AND PLAN;  ? ?#1. Rectal bleeding with IDA. D/d hoids, AVMs, colitis, polyps, stercoral ulcers etc, r/o colonic neoplasms or IBD. Hb 11.8 12/13/2020 ? ?#2.  Constipation-likely exacerbated by pain meds.  ? ?Plan: ?-Colon with 2 day prep ?-Miralax 17g po QD  ?-Minimize pain medications. ?-Increase water intake. ?-HC 2.5% BID PR x 2 weeks. 2RF ?-2DE report ([facility]) ? ? ?Discussed risks & benefits of colonoscopy. Risks including rare perforation req laparotomy, bleeding after bx/polypectomy req blood transfusion, rarely missing neoplasms, risks of anesthesia/sedation, rare risk of damage to internal organs. Benefits outweigh the risks. Patient agrees to proceed. All the questions were answered. Pt consents to proceed. ?HPI:   ? ?Leslie Velasquez is a 51 y.o. 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 ?With H/O diarrhea (d/t IBS-D)in past, now with constipation x 3 months associated with intermittent rectal bleeding.  She also had some rectal discomfort.  Has associated abdominal bloating with pellet-like stools.  No abdominal pain.  She has been taking MiraLAX 17 g p.o. once a day.  Note that she is also on pain medications for back pain.  Has been advised to get repeat colonoscopy. ? ?Denies having any upper GI symptoms.  No nausea, vomiting, heartburn, regurgitation, odynophagia or dysphagia. ? ?No sodas, chocolates, chewing gums, artificial sweeteners and candy. No NSAIDs ? ?Hb has improved to 15.0 ? ? ?Past GI procedures: ?Colonoscopy 05/2015 at [facility]: Neg ?EGD 05/2015 for dysphagia: neg ? ?Mom pt of ous- Sx for rectal prolapse ? ? ?Past Medical History:  ?Diagnosis Date  ? Acute CHF ([place]) 03/02/2016  ? Asthma   ? Blood dyscrasia   ? 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 Cancer [facility])   ? polycythemia vera cancer  ? Essential thrombocythemia ([place])  02/02/2016  ? GERD (gastroesophageal reflux disease)   ? Gestational diabetes 02/02/2016  ? glyburide  ? Gestational diabetes mellitus (GDM), antepartum   ? Hx of pyelonephritis   ? Hypertension   ? 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 Personal history of chemotherapy   ? Polycythemia vera ([place]) 02/02/2016  ? Pregnancy, supervision, high-risk 02/02/2016  ? ? ?Past Surgical History:  ?Procedure Laterality Date  ? CESAREAN SECTION N/A 02/10/2016  ? Procedure: Primary CESAREAN SECTION;  Surgeon: Brien Few, MD;  Location: [facility];  Service: Obstetrics;  Laterality: N/A;  EDD: 03/02/16  ? COLONOSCOPY    ? DILATION AND EVACUATION N/A 01/07/2015  ? Procedure: DILATATION AND EVACUATION with Tissue Sent For Chromosome Analysis;  Surgeon: Brien Few, MD;  Location: [facility];  Service: Gynecology;  Laterality: N/A;  ? LUMBAR LAMINECTOMY/DECOMPRESSION MICRODISCECTOMY Right 12/12/2020  ? Procedure: Microlumbar decompression, Microdiscectomy Lumbar five-Sacral one Right;  Surgeon: Susa Day, MD;  Location: [place];  Service: Orthopedics;  Laterality: Right;  ? MYOMECTOMY    ? TONSILLECTOMY    ? WISDOM TOOTH EXTRACTION    ? ? ?Family History  ?Problem Relation Age of Onset  ? Diabetes Mother   ? Hypertension Mother   ? Hyperlipidemia Mother   ? Cancer Father   ? Diabetes Father   ? Hypertension Father   ? Hyperlipidemia Father   ? Cancer Paternal Grandmother   ? Cancer Paternal Grandfather   ? Alcohol abuse Neg Hx   ?  Arthritis Neg Hx   ? Asthma Neg Hx   ? Birth defects Neg Hx   ? COPD Neg Hx   ? Drug abuse Neg Hx   ? Depression Neg Hx   ? Early death Neg Hx   ? Hearing loss Neg Hx   ? Heart disease Neg Hx   ? Kidney disease Neg Hx   ? Learning disabilities Neg Hx   ? Mental illness Neg Hx   ? Mental retardation Neg Hx   ? Miscarriages / Stillbirths Neg Hx   ? Stroke Neg Hx   ? Vision loss Neg Hx   ? Varicose Veins Neg Hx   ? ? ?Social History  ? ?Tobacco Use  ? Smoking status: Never   ? Smokeless tobacco: Never  ?Vaping Use  ? Vaping Use: Never used  ?Substance Use Topics  ? Alcohol use: No  ?  Alcohol/week: 0.0 standard drinks  ? Drug use: No  ? ? ?Current Outpatient Medications  ?Medication Sig Dispense Refill  ? acetaminophen (TYLENOL) 500 MG tablet Take 1,000 mg by mouth every 8 (eight) hours as needed for moderate pain.    ? albuterol (VENTOLIN HFA) 108 (90 Base) MCG/ACT inhaler Inhale 2 puffs into the lungs every 4 (four) hours as needed for shortness of breath.    ? allopurinol (ZYLOPRIM) 300 MG tablet Take 1 tablet (300 mg total) by mouth daily. 90 tablet 4  ? Ascorbic Acid (VITAMIN C PO) Take 1 tablet by mouth 2 (two) times daily.    ? Coenzyme Q10 (COQ-10 PO) Take 1 tablet by mouth 2 (two) times daily.    ? COLLAGEN PO Take 3 capsules by mouth daily.    ? folic acid (FOLVITE) 1 MG tablet Take 2 mg by mouth in the morning and at bedtime.    ? furosemide (LASIX) 20 MG tablet Take 1 tablet (20 mg total) by mouth daily. (Patient taking differently: Take 20 mg by mouth daily as needed for edema.) 30 tablet 0  ? lisinopril-hydrochlorothiazide (ZESTORETIC) 20-25 MG tablet Take 1 tablet by mouth daily.    ? Melatonin 10 MG CAPS Take 10 mg by mouth at bedtime.    ? methocarbamol (ROBAXIN) 500 MG tablet Take 1 tablet (500 mg total) by mouth every 8 (eight) hours as needed for muscle spasms. 40 tablet 1  ? metoprolol succinate (TOPROL-XL) 50 MG 24 hr tablet Take 50 mg by mouth at bedtime.    ? Multiple Vitamin (MULTIVITAMIN WITH MINERALS) TABS tablet Take 1 tablet by mouth daily.    ? ondansetron (ZOFRAN-ODT) 4 MG disintegrating tablet Take 4 mg by mouth every 8 (eight) hours as needed for nausea or vomiting.    ? oxycodone (ROXICODONE) 30 MG immediate release tablet Take 30 mg by mouth 4 (four) times daily as needed for pain.    ? OZEMPIC, 2 MG/DOSE, 8 MG/3ML SOPN Inject 2 mg into the skin once a week.    ? pantoprazole (PROTONIX) 40 MG tablet Take 40 mg by mouth at bedtime.    ? peginterferon  alfa-2a (PEGASYS) 180 MCG/ML injection 0.5 ml    ? polyethylene glycol (MIRALAX / GLYCOLAX) 17 g packet Take 17 g by mouth daily. 14 each 0  ? rosuvastatin (CRESTOR) 20 MG tablet 1 tablet    ? sertraline (ZOLOFT) 50 MG tablet Take 50 mg by mouth daily.    ? VASCEPA 1 g CAPS Take 2 g by mouth 2 (two) times daily.    ? zolpidem (AMBIEN)  5 MG tablet Take 5 mg by mouth at bedtime.    ? metFORMIN (GLUCOPHAGE) 500 MG tablet Take 500 mg by mouth 2 (two) times daily. (Patient not taking: Reported on 12/31/2021)    ? ?No current facility-administered medications for this visit.  ? ? ?Allergies  ?Allergen Reactions  ? Tape Rash and Other (See Comments)  ?  Surgical tape - burning/blisters  ? ? ?Review of Systems:  ?Constitutional: Denies fever, chills, diaphoresis, appetite change and has fatigue.  ?HEENT: Denies photophobia, eye pain, redness, hearing loss, ear pain, congestion, sore throat, rhinorrhea, sneezing, mouth sores, neck pain, neck stiffness and tinnitus.   ?Respiratory: Denies SOB, DOE, cough, chest tightness,  and wheezing.   ?Cardiovascular: Denies chest pain, palpitations and leg swelling.  ?Genitourinary: Denies dysuria, urgency, frequency, hematuria, flank pain and difficulty urinating.  ?Musculoskeletal: Denies myalgias, has back pain, no joint swelling, arthralgias and gait problem.  ?Skin: No rash.  ?Neurological: Denies dizziness, seizures, syncope, weakness, light-headedness, numbness and headaches.  ?Hematological: Denies adenopathy. Easy bruising, personal or family bleeding history  ?Psychiatric/Behavioral: has anxiety or depression ? ?  ? ?Physical Exam:   ? ?BP (!) 122/8 (BP Location: Left Arm, Patient Position: Sitting, Cuff Size: Normal)   Pulse 82   Ht '5\' 5"'$  (1.651 m)   Wt 186 lb 8 oz (84.6 kg)   SpO2 99%   BMI 31.04 kg/m?  ?Wt Readings from Last 3 Encounters:  ?12/31/21 186 lb 8 oz (84.6 kg)  ?12/12/20 200 lb (90.7 kg)  ?12/09/20 208 lb 9.6 oz (94.6 kg)  ? ?Constitutional:  Well-developed,  in no acute distress. ?Psychiatric: Normal mood and affect. Behavior is normal. ?HEENT: Pupils normal.  Conjunctivae are normal. No scleral icterus.  ?Cardiovascular: Normal rate, regular rhythm. No edema ?Pul

## 2022-01-05 ENCOUNTER — Encounter: Payer: Self-pay | Admitting: Gastroenterology

## 2022-01-11 ENCOUNTER — Telehealth: Payer: Self-pay | Admitting: Gastroenterology

## 2022-01-11 NOTE — Telephone Encounter (Signed)
Patient called to switch procedure times for tomorrow. Per patient, someone from our practice called her to let her know of earlier times available. Patient rescheduled tomorros's colonoscopy from 4pm to 11am.  ?

## 2022-01-12 ENCOUNTER — Ambulatory Visit (AMBULATORY_SURGERY_CENTER): Payer: BC Managed Care – PPO | Admitting: Gastroenterology

## 2022-01-12 ENCOUNTER — Encounter: Payer: BC Managed Care – PPO | Admitting: Gastroenterology

## 2022-01-12 ENCOUNTER — Encounter: Payer: Self-pay | Admitting: Gastroenterology

## 2022-01-12 VITALS — BP 99/58 | HR 84 | Temp 98.0°F | Resp 19

## 2022-01-12 DIAGNOSIS — K625 Hemorrhage of anus and rectum: Secondary | ICD-10-CM | POA: Diagnosis present

## 2022-01-12 DIAGNOSIS — K644 Residual hemorrhoidal skin tags: Secondary | ICD-10-CM

## 2022-01-12 DIAGNOSIS — K640 First degree hemorrhoids: Secondary | ICD-10-CM | POA: Diagnosis not present

## 2022-01-12 DIAGNOSIS — K5900 Constipation, unspecified: Secondary | ICD-10-CM

## 2022-01-12 DIAGNOSIS — K5730 Diverticulosis of large intestine without perforation or abscess without bleeding: Secondary | ICD-10-CM | POA: Diagnosis not present

## 2022-01-12 MED ORDER — SODIUM CHLORIDE 0.9 % IV SOLN: 500.0000 mL | INTRAVENOUS | Status: DC

## 2022-01-12 MED ORDER — HYDROCORTISONE (PERIANAL) 2.5 % EX CREA: 1.0000 "application " | TOPICAL_CREAM | Freq: Two times a day (BID) | CUTANEOUS | 2 refills | Status: AC

## 2022-01-12 NOTE — Patient Instructions (Signed)
?Thank you for letting us take care of your healthcare needs today. ?Please see handouts given to you on Hemorrhoids and Diverticulosis. ?Use Anusol Hydrocortisone cream 2.5% twice a day for 10 days. ?Use Miralax 1 capful 17 grams in 8 oz of water daily. ? ? ?YOU HAD AN ENDOSCOPIC PROCEDURE TODAY AT [facility]:   Refer to the procedure report that was given to you for any specific questions about what was found during the examination.  If the procedure report does not answer your questions, please call your gastroenterologist to clarify.  If you requested that your care partner not be given the details of your procedure findings, then the procedure report has been included in a sealed envelope for you to review at your convenience later. ? ?YOU SHOULD EXPECT: Some feelings of bloating in the abdomen. Passage of more gas than usual.  Walking can help get rid of the air that was put into your GI tract during the procedure and reduce the bloating. If you had a lower endoscopy (such as a colonoscopy or flexible sigmoidoscopy) you may notice spotting of blood in your stool or on the toilet paper. If you underwent a bowel prep for your procedure, you may not have a normal bowel movement for a few days. ? ?Please Note:  You might notice some irritation and congestion in your nose or some drainage.  This is from the oxygen used during your procedure.  There is no need for concern and it should clear up in a day or so. ? ?SYMPTOMS TO REPORT IMMEDIATELY: ? ?Following lower endoscopy (colonoscopy or flexible sigmoidoscopy): ? Excessive amounts of blood in the stool ? Significant tenderness or worsening of abdominal pains ? Swelling of the abdomen that is new, acute ? Fever of 100?F or higher ? ? ?For urgent or emergent issues, a gastroenterologist can be reached at any hour by calling [phone]. ?Do not use MyChart messaging for urgent concerns.  ? ? ?DIET:  We do recommend a small meal at first, but  then you may proceed to your regular diet.  Drink plenty of fluids but you should avoid alcoholic beverages for 24 hours. ? ?ACTIVITY:  You should plan to take it easy for the rest of today and you should NOT DRIVE or use heavy machinery until tomorrow (because of the sedation medicines used during the test).   ? ?FOLLOW UP: ?Our staff will call the number listed on your records 48-72 hours following your procedure to check on you and address any questions or concerns that you may have regarding the information given to you following your procedure. If we do not reach you, we will leave a message.  We will attempt to reach you two times.  During this call, we will ask if you have developed any symptoms of COVID 19. If you develop any symptoms (ie: fever, flu-like symptoms, shortness of breath, cough etc.) before then, please call [phone].  If you test positive for Covid 19 in the 2 weeks post procedure, please call and report this information to [place].   ? ?If any biopsies were taken you will be contacted by phone or by letter within the next 1-3 weeks.  Please call us at [phone] if you have not heard about the biopsies in 3 weeks.  ? ? ?SIGNATURES/CONFIDENTIALITY: ?You and/or your care partner have signed paperwork which will be entered into your electronic medical record.  These signatures attest to the fact that that the information  above on your After Visit Summary has been reviewed and is understood.  Full responsibility of the confidentiality of this discharge information lies with you and/or your care-partner.  ?

## 2022-01-12 NOTE — Progress Notes (Signed)
? ? ?Chief Complaint:  ? ?Referring Provider:  Zoila Shutter, NP    ? ? ?ASSESSMENT AND PLAN;  ? ?#1. Rectal bleeding with IDA. D/d hoids, AVMs, colitis, polyps, stercoral ulcers etc, r/o colonic neoplasms or IBD. Hb 11.8 12/13/2020 ? ?#2.  Constipation-likely exacerbated by pain meds.  ? ?Plan: ?-Colon with 2 day prep ?-Miralax 17g po QD  ?-Minimize pain medications. ?-Increase water intake. ?-HC 2.5% BID PR x 2 weeks. 2RF ?-2DE report ([facility]) ? ? ?Discussed risks & benefits of colonoscopy. Risks including rare perforation req laparotomy, bleeding after bx/polypectomy req blood transfusion, rarely missing neoplasms, risks of anesthesia/sedation, rare risk of damage to internal organs. Benefits outweigh the risks. Patient agrees to proceed. All the questions were answered. Pt consents to proceed. ?HPI:   ? ?Leslie Velasquez is a 51 y.o. 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 ?With H/O diarrhea (d/t IBS-D)in past, now with constipation x 3 months associated with intermittent rectal bleeding.  She also had some rectal discomfort.  Has associated abdominal bloating with pellet-like stools.  No abdominal pain.  She has been taking MiraLAX 17 g p.o. once a day.  Note that she is also on pain medications for back pain.  Has been advised to get repeat colonoscopy. ? ?Denies having any upper GI symptoms.  No nausea, vomiting, heartburn, regurgitation, odynophagia or dysphagia. ? ?No sodas, chocolates, chewing gums, artificial sweeteners and candy. No NSAIDs ? ?Hb has improved to 15.0 ? ? ?Past GI procedures: ?Colonoscopy 05/2015 at [facility]: Neg ?EGD 05/2015 for dysphagia: neg ? ?Mom pt of ous- Sx for rectal prolapse ? ? ?Past Medical History:  ?Diagnosis Date  ? Acute CHF ([place]) 03/02/2016  ? Asthma   ? Blood dyscrasia   ? 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 Cancer [facility])   ? polycythemia vera cancer  ? Essential thrombocythemia ([place])  02/02/2016  ? GERD (gastroesophageal reflux disease)   ? Gestational diabetes 02/02/2016  ? glyburide  ? Gestational diabetes mellitus (GDM), antepartum   ? Hx of pyelonephritis   ? Hypertension   ? 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 Personal history of chemotherapy   ? Polycythemia vera ([place]) 02/02/2016  ? Pregnancy, supervision, high-risk 02/02/2016  ? ? ?Past Surgical History:  ?Procedure Laterality Date  ? CESAREAN SECTION N/A 02/10/2016  ? Procedure: Primary CESAREAN SECTION;  Surgeon: Brien Few, MD;  Location: [place];  Service: Obstetrics;  Laterality: N/A;  EDD: 03/02/16  ? COLONOSCOPY    ? DILATION AND EVACUATION N/A 01/07/2015  ? Procedure: DILATATION AND EVACUATION with Tissue Sent For Chromosome Analysis;  Surgeon: Brien Few, MD;  Location: [facility];  Service: Gynecology;  Laterality: N/A;  ? LUMBAR LAMINECTOMY/DECOMPRESSION MICRODISCECTOMY Right 12/12/2020  ? Procedure: Microlumbar decompression, Microdiscectomy Lumbar five-Sacral one Right;  Surgeon: Susa Day, MD;  Location: [place];  Service: Orthopedics;  Laterality: Right;  ? MYOMECTOMY    ? TONSILLECTOMY    ? WISDOM TOOTH EXTRACTION    ? ? ?Family History  ?Problem Relation Age of Onset  ? Diabetes Mother   ? Hypertension Mother   ? Hyperlipidemia Mother   ? Cancer Father   ? Diabetes Father   ? Hypertension Father   ? Hyperlipidemia Father   ? Cancer Paternal Grandmother   ? Cancer Paternal Grandfather   ? Alcohol abuse Neg Hx   ?  Arthritis Neg Hx   ? Asthma Neg Hx   ? Birth defects Neg Hx   ? COPD Neg Hx   ? Drug abuse Neg Hx   ? Depression Neg Hx   ? Early death Neg Hx   ? Hearing loss Neg Hx   ? Heart disease Neg Hx   ? Kidney disease Neg Hx   ? Learning disabilities Neg Hx   ? Mental illness Neg Hx   ? Mental retardation Neg Hx   ? Miscarriages / Stillbirths Neg Hx   ? Stroke Neg Hx   ? Vision loss Neg Hx   ? Varicose Veins Neg Hx   ? ? ?Social History  ? ?Tobacco Use  ? Smoking status: Never   ? Smokeless tobacco: Never  ?Vaping Use  ? Vaping Use: Never used  ?Substance Use Topics  ? Alcohol use: No  ?  Alcohol/week: 0.0 standard drinks  ? Drug use: No  ? ? ?Current Outpatient Medications  ?Medication Sig Dispense Refill  ? acetaminophen (TYLENOL) 500 MG tablet Take 1,000 mg by mouth every 8 (eight) hours as needed for moderate pain.    ? albuterol (VENTOLIN HFA) 108 (90 Base) MCG/ACT inhaler Inhale 2 puffs into the lungs every 4 (four) hours as needed for shortness of breath.    ? allopurinol (ZYLOPRIM) 300 MG tablet Take 1 tablet (300 mg total) by mouth daily. 90 tablet 4  ? Ascorbic Acid (VITAMIN C PO) Take 1 tablet by mouth 2 (two) times daily.    ? Coenzyme Q10 (COQ-10 PO) Take 1 tablet by mouth 2 (two) times daily.    ? COLLAGEN PO Take 3 capsules by mouth daily.    ? folic acid (FOLVITE) 1 MG tablet Take 2 mg by mouth in the morning and at bedtime.    ? furosemide (LASIX) 20 MG tablet Take 1 tablet (20 mg total) by mouth daily. (Patient taking differently: Take 20 mg by mouth daily as needed for edema.) 30 tablet 0  ? hydrocortisone (ANUSOL-HC) 2.5 % rectal cream Place 1 application. rectally 2 (two) times daily. For 14 days 30 g 2  ? lisinopril-hydrochlorothiazide (ZESTORETIC) 20-25 MG tablet Take 1 tablet by mouth daily.    ? Melatonin 10 MG CAPS Take 10 mg by mouth at bedtime.    ? metFORMIN (GLUCOPHAGE) 500 MG tablet Take 500 mg by mouth 2 (two) times daily. (Patient not taking: Reported on 12/31/2021)    ? methocarbamol (ROBAXIN) 500 MG tablet Take 1 tablet (500 mg total) by mouth every 8 (eight) hours as needed for muscle spasms. 40 tablet 1  ? metoprolol succinate (TOPROL-XL) 50 MG 24 hr tablet Take 50 mg by mouth at bedtime.    ? Multiple Vitamin (MULTIVITAMIN WITH MINERALS) TABS tablet Take 1 tablet by mouth daily.    ? ondansetron (ZOFRAN-ODT) 4 MG disintegrating tablet Take 4 mg by mouth every 8 (eight) hours as needed for nausea or vomiting.    ? oxycodone (ROXICODONE) 30 MG immediate  release tablet Take 30 mg by mouth 4 (four) times daily as needed for pain.    ? OZEMPIC, 2 MG/DOSE, 8 MG/3ML SOPN Inject 2 mg into the skin once a week.    ? pantoprazole (PROTONIX) 40 MG tablet Take 40 mg by mouth at bedtime.    ? peginterferon alfa-2a (PEGASYS) 180 MCG/ML injection 0.5 ml    ? polyethylene glycol (MIRALAX / GLYCOLAX) 17 g packet Take 17 g by mouth daily. 14 each 0  ?  rosuvastatin (CRESTOR) 20 MG tablet 1 tablet    ? sertraline (ZOLOFT) 50 MG tablet Take 50 mg by mouth daily.    ? VASCEPA 1 g CAPS Take 2 g by mouth 2 (two) times daily.    ? zolpidem (AMBIEN) 5 MG tablet Take 5 mg by mouth at bedtime.    ? ?No current facility-administered medications for this visit.  ? ? ?Allergies  ?Allergen Reactions  ? Tape Rash and Other (See Comments)  ?  Surgical tape - burning/blisters  ? ? ?Review of Systems:  ?Constitutional: Denies fever, chills, diaphoresis, appetite change and has fatigue.  ?HEENT: Denies photophobia, eye pain, redness, hearing loss, ear pain, congestion, sore throat, rhinorrhea, sneezing, mouth sores, neck pain, neck stiffness and tinnitus.   ?Respiratory: Denies SOB, DOE, cough, chest tightness,  and wheezing.   ?Cardiovascular: Denies chest pain, palpitations and leg swelling.  ?Genitourinary: Denies dysuria, urgency, frequency, hematuria, flank pain and difficulty urinating.  ?Musculoskeletal: Denies myalgias, has back pain, no joint swelling, arthralgias and gait problem.  ?Skin: No rash.  ?Neurological: Denies dizziness, seizures, syncope, weakness, light-headedness, numbness and headaches.  ?Hematological: Denies adenopathy. Easy bruising, personal or family bleeding history  ?Psychiatric/Behavioral: has anxiety or depression ? ?  ? ?Physical Exam:   ? ?There were no vitals taken for this visit. ?Wt Readings from Last 3 Encounters:  ?12/31/21 186 lb 8 oz (84.6 kg)  ?12/12/20 200 lb (90.7 kg)  ?12/09/20 208 lb 9.6 oz (94.6 kg)  ? ?Constitutional:  Well-developed, in no acute  distress. ?Psychiatric: Normal mood and affect. Behavior is normal. ?HEENT: Pupils normal.  Conjunctivae are normal. No scleral icterus.  ?Cardiovascular: Normal rate, regular rhythm. No edema ?Pulmonary/che

## 2022-01-12 NOTE — Op Note (Signed)
[facility] ?Patient Name: Leslie Velasquez ?Procedure Date: 01/12/2022 10:30 AM ?MRN: [id number] ?Endoscopist: Jackquline Denmark , MD ?Age: 51 ?Referring MD:  ?Date of Birth: [birth year] ?Gender: Female ?Account #: [id number] ?Procedure:                Colonoscopy ?Indications:              Rectal bleeding ?Medicines:                Monitored Anesthesia Care ?Procedure:                Pre-Anesthesia Assessment: ?                          - Prior to the procedure, a History and Physical  ?                          was performed, and patient medications and  ?                          allergies were reviewed. The patient's tolerance of  ?                          previous anesthesia was also reviewed. The risks  ?                          and benefits of the procedure and the sedation  ?                          options and risks were discussed with the patient.  ?                          All questions were answered, and informed consent  ?                          was obtained. Prior Anticoagulants: The patient has  ?                          taken no previous anticoagulant or antiplatelet  ?                          agents. ASA Grade Assessment: II - A patient with  ?                          mild systemic disease. After reviewing the risks  ?                          and benefits, the patient was deemed in  ?                          satisfactory condition to undergo the procedure. ?                          After obtaining informed consent, the colonoscope  ?  was passed under direct vision. Throughout the  ?                          procedure, the patient's blood pressure, pulse, and  ?                          oxygen saturations were monitored continuously. The  ?                          PCF-HQ190L Colonoscope was introduced through the  ?                          anus and advanced to the 2 cm into the ileum. The  ?                          colonoscopy was performed without difficulty.  The  ?                          patient tolerated the procedure well. The quality  ?                          of the bowel preparation was good. The terminal  ?                          ileum, ileocecal valve, appendiceal orifice, and  ?                          rectum were photographed. ?Scope In: 11:10:22 AM ?Scope Out: 11:27:02 AM ?Scope Withdrawal Time: 0 hours 10 minutes 46 seconds  ?Total Procedure Duration: 0 hours 16 minutes 40 seconds  ?Findings:                 A few small-mouthed diverticula were found in the  ?                          sigmoid colon, descending colon and ascending colon. ?                          Non-bleeding internal hemorrhoids were found during  ?                          retroflexion and during perianal exam. The  ?                          hemorrhoids were moderate. ?                          The terminal ileum appeared normal. ?                          The exam was otherwise without abnormality on  ?                          direct and retroflexion views. ?  Skin tags vs condylomata were found on perianal  ?                          exam at dentate line. ?Complications:            No immediate complications. ?Estimated Blood Loss:     Estimated blood loss: none. ?Impression:               - Mild pancolonic diverticulosis. ?                          - Non-bleeding internal hemorrhoids. ?                          - The examined portion of the ileum was normal. ?                          - The examination was otherwise normal on direct  ?                          and retroflexion views. ?                          - Perianal skin tags vs early condylomata found on  ?                          perianal exam. ?                          - No specimens collected. ?Recommendation:           - Patient has a contact number available for  ?                          emergencies. The signs and symptoms of potential  ?                          delayed complications were  discussed with the  ?                          patient. Return to normal activities tomorrow.  ?                          Written discharge instructions were provided to the  ?                          patient. ?                          - Resume previous diet. ?                          - Use Benefiber one teaspoon PO daily. ?                          - Miralax 1 capful (17 grams) in 8 ounces of water  ?  PO daily. ?                          - Use HC Cream 2.5%: Apply externally BID for 10  ?                          days. 2RF ?                          - If still with anorectal problems, would recommend  ?                          surgical evaluation for  ?                          EUA/hemorrhoidectomy/cryotherapy. ?                          - The findings and recommendations were discussed  ?                          with the patient in detail. She will let us know if  ?                          she still has problems. She was given contact  ?                          numbers. I have not discussed with patient's friend  ?                          d/t HIPPA (at patient's request) ?Jackquline Denmark, MD ?01/12/2022 11:37:15 AM ?This report has been signed electronically. ?

## 2022-01-12 NOTE — Progress Notes (Signed)
Pt's states no medical or surgical changes since previsit or office visit. 

## 2022-01-12 NOTE — Progress Notes (Signed)
To Pacu, VSS. Report to [email or web address] 

## 2022-01-14 ENCOUNTER — Telehealth: Payer: Self-pay | Admitting: *Deleted

## 2022-01-14 ENCOUNTER — Telehealth: Payer: Self-pay

## 2022-01-14 NOTE — Telephone Encounter (Signed)
No answer for post procedure call back. Left VM. 

## 2022-01-14 NOTE — Telephone Encounter (Signed)
?  Follow up Call- ? ? ?  01/12/2022  ? 10:54 AM  ?Call back number  ?Post procedure Call Back phone  # [phone]  ?Permission to leave phone message Yes  ?  ? ?Post op call attempted , no answer, left WM ? ? ? ?
